# Patient Record
Sex: Male | Born: 1935 | Race: White | Hispanic: No | Smoking: Former smoker
Health system: Southern US, Community
[De-identification: ages and names within clinical notes are randomized; demographics above are authoritative.]

## PROBLEM LIST (undated history)

## (undated) DIAGNOSIS — I2510 Atherosclerotic heart disease of native coronary artery without angina pectoris: Secondary | ICD-10-CM

## (undated) DIAGNOSIS — E785 Hyperlipidemia, unspecified: Secondary | ICD-10-CM

## (undated) DIAGNOSIS — K635 Polyp of colon: Secondary | ICD-10-CM

## (undated) DIAGNOSIS — N529 Male erectile dysfunction, unspecified: Secondary | ICD-10-CM

## (undated) DIAGNOSIS — I10 Essential (primary) hypertension: Secondary | ICD-10-CM

## (undated) DIAGNOSIS — Z85038 Personal history of other malignant neoplasm of large intestine: Secondary | ICD-10-CM

## (undated) HISTORY — DX: Hyperlipidemia, unspecified: E78.5

## (undated) HISTORY — DX: Personal history of other malignant neoplasm of large intestine: Z85.038

## (undated) HISTORY — PX: TONSILLECTOMY: SUR1361

## (undated) HISTORY — DX: Atherosclerotic heart disease of native coronary artery without angina pectoris: I25.10

## (undated) HISTORY — DX: Essential (primary) hypertension: I10

## (undated) HISTORY — DX: Male erectile dysfunction, unspecified: N52.9

## (undated) HISTORY — DX: Polyp of colon: K63.5

## (undated) HISTORY — PX: CARDIAC CATHETERIZATION: SHX172

## (undated) HISTORY — PX: APPENDECTOMY: SHX54

---

## 1993-05-13 HISTORY — PX: PARTIAL COLECTOMY: SHX5273

## 1998-02-01 ENCOUNTER — Ambulatory Visit (HOSPITAL_COMMUNITY): Admission: RE | Admit: 1998-02-01 | Discharge: 1998-02-01 | Payer: Self-pay | Admitting: Gastroenterology

## 2004-03-14 ENCOUNTER — Ambulatory Visit: Payer: Self-pay | Admitting: Gastroenterology

## 2004-04-02 ENCOUNTER — Ambulatory Visit: Payer: Self-pay | Admitting: Gastroenterology

## 2007-08-12 HISTORY — PX: TYMPANOSTOMY TUBE PLACEMENT: SHX32

## 2008-04-01 ENCOUNTER — Encounter: Payer: Self-pay | Admitting: Cardiovascular Disease

## 2009-01-29 ENCOUNTER — Emergency Department (HOSPITAL_COMMUNITY): Admission: EM | Admit: 2009-01-29 | Discharge: 2009-01-29 | Payer: Self-pay | Admitting: Emergency Medicine

## 2009-02-13 ENCOUNTER — Encounter (INDEPENDENT_AMBULATORY_CARE_PROVIDER_SITE_OTHER): Payer: Self-pay | Admitting: *Deleted

## 2009-03-22 ENCOUNTER — Ambulatory Visit: Payer: Self-pay | Admitting: Gastroenterology

## 2009-04-05 ENCOUNTER — Ambulatory Visit: Payer: Self-pay | Admitting: Gastroenterology

## 2010-04-01 ENCOUNTER — Encounter: Payer: Self-pay | Admitting: Cardiovascular Disease

## 2010-12-03 ENCOUNTER — Encounter: Payer: Self-pay | Admitting: Cardiovascular Disease

## 2010-12-17 ENCOUNTER — Encounter: Payer: Self-pay | Admitting: Cardiovascular Disease

## 2010-12-18 ENCOUNTER — Encounter: Payer: Self-pay | Admitting: Cardiovascular Disease

## 2010-12-18 ENCOUNTER — Ambulatory Visit (INDEPENDENT_AMBULATORY_CARE_PROVIDER_SITE_OTHER): Payer: Medicare Other | Admitting: Cardiovascular Disease

## 2010-12-18 DIAGNOSIS — R079 Chest pain, unspecified: Secondary | ICD-10-CM | POA: Insufficient documentation

## 2010-12-18 NOTE — Progress Notes (Signed)
History of Present Illness:77 yo WM with history of colon cancer, HLD who is here today to establish cardiac care. His parents both died from strokes. No prior cardiac disease. He tells me that he has had two "events". In June he was cutting the grass and felt pains in both arms and heaviness in the chest. This was associated with SOB and diaphoresis. He rested and this went away. Lasted for five minutes. Then this occurred again two weeks ago. Same type of symptoms. He feels well today. His wife is also my patient and survived a STEMI this spring. He is concerned that he may have CAD as well.   Past Medical History  Diagnosis Date  . History of colon cancer     Followed by Dr Sheryn Bison  . Hearing loss     wears hearing aids  . ED (erectile dysfunction)     Past Surgical History  Procedure Date  . Partial colectomy 1995    Dr Samuella Cota  . Tympanostomy tube placement 4/09    Current Outpatient Prescriptions  Medication Sig Dispense Refill  . aspirin 81 MG tablet Take 81 mg by mouth daily.        Marland Kitchen b complex vitamins capsule Take 1 capsule by mouth daily.        . Multiple Vitamin (MULTIVITAMIN) tablet Take 1 tablet by mouth daily.        . nitroGLYCERIN (NITROSTAT) 0.4 MG SL tablet Place 0.4 mg under the tongue every 5 (five) minutes as needed.          Allergies not on file  History   Social History  . Marital Status: Married    Spouse Name: N/A    Number of Children: 2  . Years of Education: 48yrs   Occupational History  . retired Occupational hygienist    Social History Main Topics  . Smoking status: Former Smoker -- 1.0 packs/day for 17 years    Types: Cigarettes    Quit date: 05/13/1976  . Smokeless tobacco: Not on file  . Alcohol Use: 4.2 oz/week    7 Glasses of wine per week  . Drug Use:   . Sexually Active:    Other Topics Concern  . Not on file   Social History Narrative  . No narrative on file    Family History  Problem Relation Age of Onset  . Heart failure Mother    . Dementia Mother   . Stroke Father 31    Review of Systems:  As stated in the HPI and otherwise negative.   BP 132/82  Pulse 61  Resp 16  Ht 5\' 10"  (1.778 m)  Wt 175 lb (79.379 kg)  BMI 25.11 kg/m2  Physical Examination: General: Well developed, well nourished, NAD HEENT: OP clear, mucus membranes moist SKIN: warm, dry. No rashes. Neuro: No focal deficits Musculoskeletal: Muscle strength 5/5 all ext Psychiatric: Mood and affect normal Neck: No JVD, no carotid bruits, no thyromegaly, no lymphadenopathy. Lungs:Clear bilaterally, no wheezes, rhonci, crackles Cardiovascular: Regular rate and rhythm. No murmurs, gallops or rubs. Abdomen:Soft. Bowel sounds present. Non-tender.  Extremities: No lower extremity edema. Pulses are 2 + in the bilateral DP/PT.  EKG:NSR, rate 61 bpm.

## 2010-12-18 NOTE — Assessment & Plan Note (Addendum)
He has exertional chest pressure with h/o HLD, former tobacco use and FH of vascular disease. Will arrange stress myoview to exclude ischemia and echo to r/o structural heart disease.

## 2010-12-18 NOTE — Patient Instructions (Signed)
Your physician recommends that you schedule a follow-up appointment in:2-3 weeks with Dr. Clifton James  Your physician has requested that you have an echocardiogram. Echocardiography is a painless test that uses sound waves to create images of your heart. It provides your doctor with information about the size and shape of your heart and how well your heart's chambers and valves are working. This procedure takes approximately one hour. There are no restrictions for this procedure.  Your physician has requested that you have en exercise stress myoview. For further information please visit [email or web address] Please follow instruction sheet, as given.

## 2011-01-01 ENCOUNTER — Encounter: Payer: Self-pay | Admitting: Cardiology

## 2011-01-01 ENCOUNTER — Ambulatory Visit (INDEPENDENT_AMBULATORY_CARE_PROVIDER_SITE_OTHER): Payer: Medicare Other | Admitting: Cardiovascular Disease

## 2011-01-01 ENCOUNTER — Ambulatory Visit (HOSPITAL_BASED_OUTPATIENT_CLINIC_OR_DEPARTMENT_OTHER): Payer: Medicare Other | Admitting: Radiology

## 2011-01-01 ENCOUNTER — Encounter: Payer: Self-pay | Admitting: Cardiovascular Disease

## 2011-01-01 DIAGNOSIS — R079 Chest pain, unspecified: Secondary | ICD-10-CM

## 2011-01-01 DIAGNOSIS — E785 Hyperlipidemia, unspecified: Secondary | ICD-10-CM | POA: Insufficient documentation

## 2011-01-01 DIAGNOSIS — Z01810 Encounter for preprocedural cardiovascular examination: Secondary | ICD-10-CM

## 2011-01-01 DIAGNOSIS — R072 Precordial pain: Secondary | ICD-10-CM | POA: Insufficient documentation

## 2011-01-01 DIAGNOSIS — R0789 Other chest pain: Secondary | ICD-10-CM

## 2011-01-01 DIAGNOSIS — R9430 Abnormal result of cardiovascular function study, unspecified: Secondary | ICD-10-CM | POA: Insufficient documentation

## 2011-01-01 DIAGNOSIS — I491 Atrial premature depolarization: Secondary | ICD-10-CM

## 2011-01-01 DIAGNOSIS — R9431 Abnormal electrocardiogram [ECG] [EKG]: Secondary | ICD-10-CM

## 2011-01-01 DIAGNOSIS — I079 Rheumatic tricuspid valve disease, unspecified: Secondary | ICD-10-CM | POA: Insufficient documentation

## 2011-01-01 DIAGNOSIS — I4949 Other premature depolarization: Secondary | ICD-10-CM

## 2011-01-01 DIAGNOSIS — R0609 Other forms of dyspnea: Secondary | ICD-10-CM

## 2011-01-01 LAB — CBC WITH DIFFERENTIAL/PLATELET
Basophils Absolute: 0 10*3/uL (ref 0.0–0.1)
Eosinophils Relative: 1.9 % (ref 0.0–5.0)
Lymphocytes Relative: 21.7 % (ref 12.0–46.0)
Monocytes Relative: 8.7 % (ref 3.0–12.0)
Neutrophils Relative %: 67.2 % (ref 43.0–77.0)
Platelets: 189 10*3/uL (ref 150.0–400.0)
RDW: 14.1 % (ref 11.5–14.6)
WBC: 5.9 10*3/uL (ref 4.5–10.5)

## 2011-01-01 LAB — PROTIME-INR
INR: 1 ratio (ref 0.8–1.0)
Prothrombin Time: 11 s (ref 10.2–12.4)

## 2011-01-01 LAB — BASIC METABOLIC PANEL
BUN: 15 mg/dL (ref 6–23)
Calcium: 9.9 mg/dL (ref 8.4–10.5)
GFR: 81.69 mL/min (ref >60.00)
Glucose, Bld: 101 mg/dL — ABNORMAL HIGH (ref 70–99)

## 2011-01-01 MED ORDER — TECHNETIUM TC 99M TETROFOSMIN IV KIT
11.0000 | PACK | Freq: Once | INTRAVENOUS | Status: AC | PRN
  Administered 2011-01-01: 11 via INTRAVENOUS

## 2011-01-01 MED ORDER — TECHNETIUM TC 99M TETROFOSMIN IV KIT
33.0000 | PACK | Freq: Once | INTRAVENOUS | Status: AC | PRN
  Administered 2011-01-01: 33 via INTRAVENOUS

## 2011-01-01 NOTE — Assessment & Plan Note (Signed)
See above. Plan cath.  

## 2011-01-01 NOTE — Progress Notes (Signed)
[facility] SITE 3 NUCLEAR MED [address] [place] [address] [phone]  Cardiology Nuclear Med Study  Daniel [name] is a 77 y.o. male [id number] [birth year]   Nuclear Med Background Indication for Stress Test:  Evaluation for Ischemia History:  No previous documented CAD Cardiac Risk Factors: History of Smoking and Lipids  Symptoms:  Chest Pressure, Diaphoresis, Dizziness, DOE, Nausea, Palpitations and SOB   Nuclear Pre-Procedure Caffeine/Decaff Intake:  None NPO After: 7:00pm   Lungs:  clear IV 0.9% NS with Angio Cath:  20g  IV Site: R Antecubital  IV Started by:  Stanton Kidney, EMT-P  Chest Size (in):  40 Cup Size: n/a  Height: 5\' 10"  (1.778 m)  Weight:  173 lb (78.472 kg)  BMI:  Body mass index is 24.82 kg/(m^2). Tech Comments:  This patient walked the treadmill and experienced chest pain and significant ST,T, changes on his EKG. The patient's cardiologist C.McAlhany consulted and saw the patient this day. The patient was given 1 sublingual Nitro with total relief of pain. S.Williams EMTP  T  Nuclear Med Study 1 or 2 day study: 1 day  Stress Test Type:  Stress  Reading MD: Willa Rough, MD  Order Authorizing Provider:  C.McAlhany  Resting Radionuclide: Technetium 107m Tetrofosmin  Resting Radionuclide Dose: 11.0 mCi   Stress Radionuclide:  Technetium 82m Tetrofosmin  Stress Radionuclide Dose: 33.0 mCi           Stress Protocol Rest HR: 54 Stress HR: 129  Rest BP: 141/71 Stress BP: 163/88  Exercise Time (min): 5:15 METS: 6.80   Predicted Max HR: 143 bpm % Max HR: 90.21 bpm Rate Pressure Product: 24401   Dose of Adenosine (mg):  n/a Dose of Lexiscan: n/a mg  Dose of Atropine (mg): n/a Dose of Dobutamine: n/a mcg/kg/min (at max HR)  Stress Test Technologist: Milana Na, EMT-P  Nuclear Technologist:  Doyne Keel, CNMT     Rest Procedure:  Myocardial perfusion imaging was performed at rest 45 minutes following the intravenous  administration of Technetium 84m Tetrofosmin. Rest ECG: Sinus Bradycardia with pvcs  Stress Procedure:  The patient exercised for 5:15.  The patient stopped due to fatigue and chest pain 4/10.  There were + significant ST-T wave changes and occ pvcs/pacs with exercise.  Technetium 43m Tetrofosmin was injected at peak exercise and myocardial perfusion imaging was performed after a brief delay. Stress ECG: Significant ST abnormalities consistent with ischemia.  QPS Raw Data Images:  Patient motion noted; appropriate software correction applied. Stress Images:  Marked decrease in activity in the entire anterior wall,  Septum,  and apex. Rest Images:  Slight decrease in activity in the septum Subtraction (SDS):  Marked ischemia Transient Ischemic Dilatation (Normal <1.22):  1.15 Lung/Heart Ratio (Normal <0.45):  0.35  Quantitative Gated Spect Images QGS EDV:  125 ml QGS ESV:  67 ml QGS cine images:  hypokinesis of the anterior wall and septum and apex QGS EF: 46%  Impression Exercise Capacity:  Poor exercise capacity. BP Response:  Hypotensive blood pressure response. Clinical Symptoms:  Significant chest pain. ECG Impression:  Significant ST abnormalities consistent with ischemia. Comparison with Prior Nuclear Study: No previous nuclear study performed  Overall Impression:  Abnormal stress nuclear study. There is evidence of significant ischemia. The patient was seen immediately by Dr. Alma Downs

## 2011-01-01 NOTE — Patient Instructions (Signed)
Your physician has requested that you have a cardiac catheterization. Cardiac catheterization is used to diagnose and/or treat various heart conditions. Doctors may recommend this procedure for a number of different reasons. The most common reason is to evaluate chest pain. Chest pain can be a symptom of coronary artery disease (CAD), and cardiac catheterization can show whether plaque is narrowing or blocking your heart's arteries. This procedure is also used to evaluate the valves, as well as measure the blood flow and oxygen levels in different parts of your heart. For further information please visit [email or web address] Please follow instruction sheet, as given. Tomorrow 8/22 @ 7:30 am. BE THERE at 6:30 am.

## 2011-01-01 NOTE — Assessment & Plan Note (Signed)
Abnormal myoview c/w a large area of reversible ischemia. Will plan left heart cath in am in the oupt cath lab. Will get precath labs today. D/w pt and his wife. He agrees to proceed.

## 2011-01-01 NOTE — Progress Notes (Signed)
History of Present Illness::77 yo WM with history of colon cancer, HLD who is here today for cardiac follow up. I saw him two weeks ago for cardiac evaluation. He described two "events". In June he was cutting the grass and felt pains in both arms and heaviness in the chest. This was associated with SOB and diaphoresis. He rested and this went away. Lasted for five minutes. This occurred several more times. His wife is also my patient and survived a STEMI this spring. I set up a stress myoview which was performed today. He had chest pressure at peak exercise and EKG changes c/w ischemia. His myoview images showed a large area of reversible ischemia including the septum and apex. He has no change in his clinical status.    Past Medical History  Diagnosis Date  . History of colon cancer     Followed by Dr Sheryn Bison  . Hearing loss     wears hearing aids  . ED (erectile dysfunction)     Past Surgical History  Procedure Date  . Partial colectomy 1995    Dr Samuella Cota  . Tympanostomy tube placement 4/09  . Appendectomy   . Tonsillectomy     Current Outpatient Prescriptions  Medication Sig Dispense Refill  . aspirin 81 MG tablet Take 81 mg by mouth daily.        Marland Kitchen b complex vitamins capsule Take 1 capsule by mouth daily.        . Multiple Vitamin (MULTIVITAMIN) tablet Take 1 tablet by mouth daily.        . nitroGLYCERIN (NITROSTAT) 0.4 MG SL tablet Place 0.4 mg under the tongue every 5 (five) minutes as needed.         No current facility-administered medications for this visit.   Facility-Administered Medications Ordered in Other Visits  Medication Dose Route Frequency Provider Last Rate Last Dose  . technetium tetrofosmin (TC-MYOVIEW) injection 11 milli Curie  11 milli Curie Intravenous Once PRN Luis Abed, MD   11 milli Curie at 01/01/11 [phone]  . technetium tetrofosmin (TC-MYOVIEW) injection 33 milli Curie  33 milli Curie Intravenous Once PRN Luis Abed, MD   33 milli Curie at  01/01/11 1100    No Known Allergies  History   Social History  . Marital Status: Married    Spouse Name: N/A    Number of Children: 2  . Years of Education: 1yrs   Occupational History  . retired Occupational hygienist    Social History Main Topics  . Smoking status: Former Smoker -- 1.0 packs/day for 17 years    Types: Cigarettes    Quit date: 05/13/1976  . Smokeless tobacco: Not on file  . Alcohol Use: 4.2 oz/week    7 Glasses of wine per week  . Drug Use:   . Sexually Active:    Other Topics Concern  . Not on file   Social History Narrative  . No narrative on file    Family History  Problem Relation Age of Onset  . Heart failure Mother   . Dementia Mother   . Stroke Father 9    Review of Systems:  As stated in the HPI and otherwise negative.   BP 131/66  Pulse 54  Physical Examination: General: Well developed, well nourished, NAD HEENT: OP clear, mucus membranes moist SKIN: warm, dry. No rashes. Neuro: No focal deficits Musculoskeletal: Muscle strength 5/5 all ext Psychiatric: Mood and affect normal Neck: No JVD, no carotid bruits, no thyromegaly, no lymphadenopathy.  Lungs:Clear bilaterally, no wheezes, rhonci, crackles Cardiovascular: Regular rate and rhythm. No murmurs, gallops or rubs. Abdomen:Soft. Bowel sounds present. Non-tender.  Extremities: No lower extremity edema. Pulses are 2 + in the bilateral DP/PT.  Stress myoview: 01/01/11:  Septal and apical ischemia. LVEF 46%.

## 2011-01-02 ENCOUNTER — Inpatient Hospital Stay (HOSPITAL_COMMUNITY): Payer: Medicare Other

## 2011-01-02 ENCOUNTER — Inpatient Hospital Stay (HOSPITAL_BASED_OUTPATIENT_CLINIC_OR_DEPARTMENT_OTHER)
Admission: RE | Admit: 2011-01-02 | Discharge: 2011-01-02 | Disposition: A | Discharge status: Home Health | Payer: Medicare Other | Source: Ambulatory Visit | Attending: Cardiovascular Disease | Admitting: Cardiovascular Disease

## 2011-01-02 ENCOUNTER — Inpatient Hospital Stay (HOSPITAL_COMMUNITY)
Admission: AD | Admit: 2011-01-02 | Discharge: 2011-01-08 | DRG: 234 | Disposition: A | Discharge status: Home Health | Payer: Medicare Other | Source: Ambulatory Visit | Attending: Surgery | Admitting: Surgery

## 2011-01-02 DIAGNOSIS — I519 Heart disease, unspecified: Secondary | ICD-10-CM | POA: Diagnosis present

## 2011-01-02 DIAGNOSIS — D696 Thrombocytopenia, unspecified: Secondary | ICD-10-CM | POA: Diagnosis present

## 2011-01-02 DIAGNOSIS — I200 Unstable angina: Secondary | ICD-10-CM | POA: Diagnosis present

## 2011-01-02 DIAGNOSIS — K5900 Constipation, unspecified: Secondary | ICD-10-CM | POA: Diagnosis present

## 2011-01-02 DIAGNOSIS — E785 Hyperlipidemia, unspecified: Secondary | ICD-10-CM | POA: Diagnosis present

## 2011-01-02 DIAGNOSIS — D62 Acute posthemorrhagic anemia: Secondary | ICD-10-CM | POA: Diagnosis not present

## 2011-01-02 DIAGNOSIS — Z85038 Personal history of other malignant neoplasm of large intestine: Secondary | ICD-10-CM | POA: Insufficient documentation

## 2011-01-02 DIAGNOSIS — I2510 Atherosclerotic heart disease of native coronary artery without angina pectoris: Principal | ICD-10-CM | POA: Diagnosis present

## 2011-01-02 DIAGNOSIS — Z87891 Personal history of nicotine dependence: Secondary | ICD-10-CM

## 2011-01-02 LAB — CBC
MCH: 33.8 pg (ref 26.0–34.0)
Platelets: 168 10*3/uL (ref 150–400)
RBC: 4.23 MIL/uL (ref 4.22–5.81)
WBC: 6.9 10*3/uL (ref 4.0–10.5)

## 2011-01-02 LAB — COMPREHENSIVE METABOLIC PANEL
ALT: 16 U/L (ref 0–53)
Albumin: 3.6 g/dL (ref 3.5–5.2)
Calcium: 9.6 mg/dL (ref 8.4–10.5)
GFR calc Af Amer: >60 mL/min (ref >60)
Glucose, Bld: 108 mg/dL — ABNORMAL HIGH (ref 70–99)
Sodium: 139 mEq/L (ref 135–145)
Total Protein: 6.7 g/dL (ref 6.0–8.3)

## 2011-01-02 LAB — PROTIME-INR
INR: 1.02 (ref 0.00–1.49)
Prothrombin Time: 13.6 seconds (ref 11.6–15.2)

## 2011-01-02 LAB — APTT: aPTT: 32 seconds (ref 24–37)

## 2011-01-02 NOTE — Cardiovascular Report (Signed)
[name], Daniel [name] NO.:  [id number]  MEDICAL RECORD NO.:  [id number]  LOCATION:                                 FACILITY:  PHYSICIAN:  Verne Carrow, MDDATE OF BIRTH:  1932-01-19  DATE OF PROCEDURE:  01/02/2011 DATE OF DISCHARGE:                           CARDIAC CATHETERIZATION   PRIMARY CARE PHYSICIAN:  Robert L. Foy Guadalajara, MD  PROCEDURES PERFORMED: 1. Left heart catheterization. 2. Selective coronary angiography. 3. Left ventricular angiogram.  OPERATOR:  Verne Carrow, MD  INDICATION:  This is a very pleasant 77 year old Caucasian male with a history of colon cancer in remission as well as hyperlipidemia who I saw in the office several weeks ago with complaints of exertional chest pain.  I actually took care of the patient's wife earlier this year and he was self-referral for chest pain.  Based on his symptoms, we arranged exercise stress Myoview test yesterday in the office.  With 5 minutes of exercise, the patient developed severe substernal chest pressure as well as ST-segment depression.  His stress Myoview images showed reversible ischemia in the anterior wall as well as the septum and apex.  Based on these findings, we arranged for a diagnostic left heart catheterization this morning.  DETAILS OF PROCEDURE:  The patient was brought to the outpatient cardiac catheterization laboratory after signing informed consent for the procedure.  The right groin was prepped and draped in sterile fashion. 1% lidocaine was used for local anesthesia.  A 4-French sheath was inserted into the right femoral artery without difficulty.  Standard diagnostic catheters were used to perform selective coronary angiography.  A pigtail catheter was used to perform a left ventricular angiogram.  All catheter exchanges were performed over a wire.  The patient tolerated the procedure well.  The patient was taken to the recovery room in stable condition.  There  were no immediate complications.  HEMODYNAMIC FINDINGS:  Central aortic pressure 144/72.  Left ventricular pressure 144/90/27.  ANGIOGRAPHIC FINDINGS: 1. The left main coronary artery had distal 60% to 70% stenosis. 2. The left anterior descending artery was a large vessel that coursed     to the apex and gave off 3 small diagonal branches.  The left     anterior descending artery had a severe 99% stenosis extending from     the ostium down into the proximal vessel.  There was no other     significant disease in the left anterior descending artery. 3. Circumflex artery was a large dominant vessel with four obtuse     marginal branches.  There was no disease in this vessel. 4. The right coronary artery was a small nondominant vessel with no     disease. 5. Left ventricular angiogram was performed in the RAO projection and     showed low normal left ventricular systolic function with ejection     fraction of 50%.  There was hypokinesis of the apex and the     inferoapical wall.  IMPRESSION: 1. Unstable angina. 2. Severe left main artery disease with involvement of the ostium of     the left anterior descending artery. 3. Mild left ventricular systolic dysfunction.  RECOMMENDATIONS:  At  this time, we will admit the patient to a telemetry bed.  We will start him on a beta-blocker and aspirin and statin.  We will obtain a consultation to the Cardiothoracic Surgery Service for a potential coronary artery bypass grafting surgery.  I reviewed the findings with the patient and his wife.  I feel that the involvement of the left main artery in the ostium of the left anterior descending artery puts this in a position where percutaneous therapy would be more risky for this patient.  I think that he would do well with bypass of the left anterior descending artery.  I will discuss further with my surgical colleagues.     Verne Carrow, MD     CM/MEDQ  D:  01/02/2011  T:   01/02/2011  Job:  [id number]  cc:   Molly Maduro L. Foy Guadalajara, M.D.  Electronically Signed by Verne Carrow MD on 01/02/2011 01:31:27 PM

## 2011-01-03 DIAGNOSIS — Z01810 Encounter for preprocedural cardiovascular examination: Secondary | ICD-10-CM

## 2011-01-03 DIAGNOSIS — I2510 Atherosclerotic heart disease of native coronary artery without angina pectoris: Secondary | ICD-10-CM

## 2011-01-03 LAB — HEMOGLOBIN A1C
Hgb A1c MFr Bld: 5.6 % (ref <5.7)
Mean Plasma Glucose: 114 mg/dL (ref <117)

## 2011-01-03 LAB — URINALYSIS, ROUTINE W REFLEX MICROSCOPIC
Leukocytes, UA: NEGATIVE
Nitrite: NEGATIVE
Protein, ur: NEGATIVE mg/dL
Specific Gravity, Urine: 1.012 (ref 1.005–1.030)
Urobilinogen, UA: 0.2 mg/dL (ref 0.0–1.0)

## 2011-01-03 LAB — LIPID PANEL
Cholesterol: 195 mg/dL (ref 0–200)
LDL Cholesterol: 100 mg/dL — ABNORMAL HIGH (ref 0–99)
VLDL: 13 mg/dL (ref 0–40)

## 2011-01-04 ENCOUNTER — Inpatient Hospital Stay (HOSPITAL_COMMUNITY): Payer: Medicare Other

## 2011-01-04 DIAGNOSIS — I2510 Atherosclerotic heart disease of native coronary artery without angina pectoris: Secondary | ICD-10-CM

## 2011-01-04 HISTORY — PX: OTHER SURGICAL HISTORY: SHX169

## 2011-01-04 LAB — POCT I-STAT 3, ART BLOOD GAS (G3+)
Acid-base deficit: 2 mmol/L (ref 0.0–2.0)
Bicarbonate: 24.8 mEq/L — ABNORMAL HIGH (ref 20.0–24.0)
Bicarbonate: 23.2 mEq/L (ref 20.0–24.0)
O2 Saturation: 100 %
O2 Saturation: 100 %
O2 Saturation: 100 %
O2 Saturation: 99 %
Patient temperature: 36.6
TCO2: 26 mmol/L (ref 0–100)
TCO2: 24 mmol/L (ref 0–100)
TCO2: 24 mmol/L (ref 0–100)
pCO2 arterial: 42.7 mmHg (ref 35.0–45.0)
pCO2 arterial: 37 mmHg (ref 35.0–45.0)
pCO2 arterial: 32.7 mmHg — ABNORMAL LOW (ref 35.0–45.0)
pCO2 arterial: 32.8 mmHg — ABNORMAL LOW (ref 35.0–45.0)
pCO2 arterial: 41.7 mmHg (ref 35.0–45.0)
pH, Arterial: 7.374 (ref 7.350–7.450)
pH, Arterial: 7.434 (ref 7.350–7.450)
pH, Arterial: 7.35 (ref 7.350–7.450)
pO2, Arterial: 475 mmHg — ABNORMAL HIGH (ref 80.0–100.0)
pO2, Arterial: 404 mmHg — ABNORMAL HIGH (ref 80.0–100.0)

## 2011-01-04 LAB — POCT I-STAT 4, (NA,K, GLUC, HGB,HCT)
Glucose, Bld: 138 mg/dL — ABNORMAL HIGH (ref 70–99)
Glucose, Bld: 91 mg/dL (ref 70–99)
Glucose, Bld: 118 mg/dL — ABNORMAL HIGH (ref 70–99)
Glucose, Bld: 120 mg/dL — ABNORMAL HIGH (ref 70–99)
HCT: 37 % — ABNORMAL LOW (ref 39.0–52.0)
HCT: 36 % — ABNORMAL LOW (ref 39.0–52.0)
HCT: 31 % — ABNORMAL LOW (ref 39.0–52.0)
HCT: 27 % — ABNORMAL LOW (ref 39.0–52.0)
Hemoglobin: 12.2 g/dL — ABNORMAL LOW (ref 13.0–17.0)
Hemoglobin: 10.5 g/dL — ABNORMAL LOW (ref 13.0–17.0)
Potassium: 4.5 mEq/L (ref 3.5–5.1)
Potassium: 4.5 mEq/L (ref 3.5–5.1)
Sodium: 137 mEq/L (ref 135–145)
Sodium: 137 mEq/L (ref 135–145)
Sodium: 134 mEq/L — ABNORMAL LOW (ref 135–145)
Sodium: 136 mEq/L (ref 135–145)

## 2011-01-04 LAB — CBC
HCT: 45.8 % (ref 39.0–52.0)
HCT: 33.7 % — ABNORMAL LOW (ref 39.0–52.0)
Hemoglobin: 11.2 g/dL — ABNORMAL LOW (ref 13.0–17.0)
MCH: 33.3 pg (ref 26.0–34.0)
MCH: 33.1 pg (ref 26.0–34.0)
MCHC: 34.5 g/dL (ref 30.0–36.0)
MCHC: 34.9 g/dL (ref 30.0–36.0)
MCV: 95 fL (ref 78.0–100.0)
Platelets: 91 10*3/uL — ABNORMAL LOW (ref 150–400)
RBC: 3.38 MIL/uL — ABNORMAL LOW (ref 4.22–5.81)
RBC: 3.56 MIL/uL — ABNORMAL LOW (ref 4.22–5.81)
RDW: 13.7 % (ref 11.5–15.5)
RDW: 13.2 % (ref 11.5–15.5)
WBC: 10.7 10*3/uL — ABNORMAL HIGH (ref 4.0–10.5)

## 2011-01-04 LAB — POCT I-STAT GLUCOSE: Glucose, Bld: 102 mg/dL — ABNORMAL HIGH (ref 70–99)

## 2011-01-04 LAB — BLOOD GAS, ARTERIAL
FIO2: 0.21 %
O2 Saturation: 97.9 %
Patient temperature: 98.6
pH, Arterial: 7.411 (ref 7.350–7.450)

## 2011-01-04 LAB — GLUCOSE, CAPILLARY
Glucose-Capillary: 144 mg/dL — ABNORMAL HIGH (ref 70–99)
Glucose-Capillary: 108 mg/dL — ABNORMAL HIGH (ref 70–99)

## 2011-01-04 LAB — BASIC METABOLIC PANEL
BUN: 14 mg/dL (ref 6–23)
Calcium: 10.4 mg/dL (ref 8.4–10.5)
GFR calc Af Amer: >60 mL/min (ref >60)
GFR calc non Af Amer: >60 mL/min (ref >60)
Glucose, Bld: 99 mg/dL (ref 70–99)
Potassium: 3.8 mEq/L (ref 3.5–5.1)
Sodium: 140 mEq/L (ref 135–145)

## 2011-01-04 LAB — CREATININE, SERUM
GFR calc Af Amer: >60 mL/min (ref >60)
GFR calc non Af Amer: >60 mL/min (ref >60)

## 2011-01-04 LAB — MAGNESIUM: Magnesium: 2.6 mg/dL — ABNORMAL HIGH (ref 1.5–2.5)

## 2011-01-04 LAB — PLATELET COUNT: Platelets: 109 10*3/uL — ABNORMAL LOW (ref 150–400)

## 2011-01-05 ENCOUNTER — Other Ambulatory Visit: Payer: Self-pay

## 2011-01-05 ENCOUNTER — Inpatient Hospital Stay (HOSPITAL_COMMUNITY): Payer: Medicare Other

## 2011-01-05 LAB — CBC
HCT: 33.6 % — ABNORMAL LOW (ref 39.0–52.0)
Hemoglobin: 11.1 g/dL — ABNORMAL LOW (ref 13.0–17.0)
MCH: 32.8 pg (ref 26.0–34.0)
MCHC: 34.4 g/dL (ref 30.0–36.0)
MCV: 95.7 fL (ref 78.0–100.0)
RBC: 3.39 MIL/uL — ABNORMAL LOW (ref 4.22–5.81)
RDW: 13.7 % (ref 11.5–15.5)
WBC: 8.5 10*3/uL (ref 4.0–10.5)
WBC: 11.9 10*3/uL — ABNORMAL HIGH (ref 4.0–10.5)

## 2011-01-05 LAB — BASIC METABOLIC PANEL
CO2: 23 mEq/L (ref 19–32)
Chloride: 106 mEq/L (ref 96–112)
GFR calc Af Amer: >60 mL/min (ref >60)
Potassium: 4.1 mEq/L (ref 3.5–5.1)

## 2011-01-05 LAB — GLUCOSE, CAPILLARY
Glucose-Capillary: 110 mg/dL — ABNORMAL HIGH (ref 70–99)
Glucose-Capillary: 127 mg/dL — ABNORMAL HIGH (ref 70–99)

## 2011-01-05 LAB — MAGNESIUM: Magnesium: 2.3 mg/dL (ref 1.5–2.5)

## 2011-01-05 LAB — CREATININE, SERUM: Creatinine, Ser: 0.91 mg/dL (ref 0.50–1.35)

## 2011-01-06 ENCOUNTER — Inpatient Hospital Stay (HOSPITAL_COMMUNITY): Payer: Medicare Other

## 2011-01-06 LAB — CROSSMATCH
ABO/RH(D): A POS
Unit division: 0

## 2011-01-06 LAB — BASIC METABOLIC PANEL
BUN: 18 mg/dL (ref 6–23)
CO2: 28 mEq/L (ref 19–32)
Calcium: 9.2 mg/dL (ref 8.4–10.5)
Chloride: 101 mEq/L (ref 96–112)
Creatinine, Ser: 0.79 mg/dL (ref 0.50–1.35)
GFR calc Af Amer: >60 mL/min (ref >60)
GFR calc non Af Amer: >60 mL/min (ref >60)
Glucose, Bld: 131 mg/dL — ABNORMAL HIGH (ref 70–99)
Potassium: 3.7 mEq/L (ref 3.5–5.1)
Sodium: 133 mEq/L — ABNORMAL LOW (ref 135–145)

## 2011-01-06 LAB — CBC
HCT: 31.4 % — ABNORMAL LOW (ref 39.0–52.0)
Hemoglobin: 10.6 g/dL — ABNORMAL LOW (ref 13.0–17.0)
MCH: 32.3 pg (ref 26.0–34.0)
MCHC: 33.8 g/dL (ref 30.0–36.0)
MCV: 95.7 fL (ref 78.0–100.0)
Platelets: 96 10*3/uL — ABNORMAL LOW (ref 150–400)
RBC: 3.28 MIL/uL — ABNORMAL LOW (ref 4.22–5.81)
RDW: 13.7 % (ref 11.5–15.5)
WBC: 9.2 10*3/uL (ref 4.0–10.5)

## 2011-01-06 LAB — GLUCOSE, CAPILLARY
Glucose-Capillary: 119 mg/dL — ABNORMAL HIGH (ref 70–99)
Glucose-Capillary: 107 mg/dL — ABNORMAL HIGH (ref 70–99)

## 2011-01-07 ENCOUNTER — Inpatient Hospital Stay (HOSPITAL_COMMUNITY): Payer: Medicare Other

## 2011-01-07 LAB — CBC
HCT: 31.1 % — ABNORMAL LOW (ref 39.0–52.0)
Hemoglobin: 10.8 g/dL — ABNORMAL LOW (ref 13.0–17.0)
MCH: 32.9 pg (ref 26.0–34.0)
MCHC: 34.7 g/dL (ref 30.0–36.0)
MCV: 94.8 fL (ref 78.0–100.0)
Platelets: 108 10*3/uL — ABNORMAL LOW (ref 150–400)
RBC: 3.28 MIL/uL — ABNORMAL LOW (ref 4.22–5.81)
RDW: 13.6 % (ref 11.5–15.5)
WBC: 7.8 10*3/uL (ref 4.0–10.5)

## 2011-01-07 LAB — BASIC METABOLIC PANEL
BUN: 15 mg/dL (ref 6–23)
CO2: 28 mEq/L (ref 19–32)
Calcium: 9.3 mg/dL (ref 8.4–10.5)
Chloride: 104 mEq/L (ref 96–112)
Creatinine, Ser: 0.68 mg/dL (ref 0.50–1.35)
GFR calc Af Amer: >60 mL/min (ref >60)
GFR calc non Af Amer: >60 mL/min (ref >60)
Glucose, Bld: 117 mg/dL — ABNORMAL HIGH (ref 70–99)
Potassium: 3.6 mEq/L (ref 3.5–5.1)
Sodium: 137 mEq/L (ref 135–145)

## 2011-01-08 LAB — POCT I-STAT, CHEM 8
Calcium, Ion: 1.23 mmol/L (ref 1.12–1.32)
Chloride: 102 mEq/L (ref 96–112)
HCT: 36 % — ABNORMAL LOW (ref 39.0–52.0)
Potassium: 4 mEq/L (ref 3.5–5.1)
Sodium: 135 mEq/L (ref 135–145)

## 2011-01-08 NOTE — Progress Notes (Signed)
Reviewed and ok. cdm

## 2011-01-08 NOTE — Progress Notes (Signed)
Reviewed and ok. cdm 

## 2011-01-09 ENCOUNTER — Ambulatory Visit: Payer: Medicare Other | Admitting: Cardiovascular Disease

## 2011-01-11 NOTE — Consult Note (Signed)
Daniel [name], Daniel [name] NO.:  [id number]  MEDICAL RECORD NO.:  [id number]  LOCATION:  3743                         FACILITY:  [facility]  PHYSICIAN:  Evelene Croon, M.D.     DATE OF BIRTH:  1931/12/01  DATE OF CONSULTATION:  01/02/2011 DATE OF DISCHARGE:                                CONSULTATION   REFERRING PHYSICIAN:  Verne Carrow, MD  REASON FOR CONSULTATION:  Left main and severe single-vessel coronary artery disease.  CLINICAL HISTORY:  I was asked by Dr. Clifton James to evaluate Daniel [name] for consideration of coronary artery bypass graft surgery.  He is a 77- year-old active gentleman with hyperlipidemia who has had only two episodes of chest discomfort.  The first one was in June was associated with mowing his lawn.  He developed chest discomfort radiating into both arms associated with shortness of breath and diaphoresis.  He rested on one knee for few minutes and this went away and he finished mowing his grass.  He said the second episode occurred at the end of July when he was driving to his primary physician's office and resolved.  He referred himself to Dr. Clifton James for evaluation since Dr. Clifton James had taken care of his wife who had a STEMI this past spring.  He underwent a stress Myoview examination, which showed a large area of reversible ischemia including the septum and apex.  He underwent cardiac catheterization today, which showed a diffusely calcified left main with 60% stenosis. There was 99% ostial and proximal LAD stenosis.  This was a large vessel that wrapped around the apex.  Left circumflex was a large dominant system with no disease with in it.  The right coronary artery was small and nondominant without any disease in it.  Left ventricular ejection fraction was about 50% with hypokinesis of the inferior apical region.  His review of systems is as follows:  GENERAL:  He denies any fever or chills.  He has had no recent weight  changes.  He has had some fatigue you that is noted for several months.  EYES:  Negative.  ENT:  He does have hearing loss and wears bilateral hearing aids.  ENDOCRINE:  He denies diabetes and hypothyroidism.  CARDIOVASCULAR:  As above.  He denies any PND or orthopnea.  He has had no peripheral edema or palpitations.  RESPIRATORY:  Denies cough or sputum production.  GI: Has had no nausea or vomiting.  Denies melena and bright red blood per rectum.  GU:  He denies dysuria or hematuria.  MUSCULOSKELETAL:  Denies arthralgias or myalgias.  NEUROLOGICAL:  Denies any focal weakness or numbness.  Denies dizziness or syncope.  He has never had TIA or stroke. HEMATOLOGICAL:  Negative.  ALLERGIES:  None.  MEDICATIONS:  None.  PAST MEDICAL HISTORY:  Significant for hyperlipidemia.  He has a history of colon cancer and underwent partial colectomy by Dr. Samuella Cota in 1995. He underwent appendectomy at that time.  He has had no evidence of recurrence and followed by Dr. Sheryn Bison with colonoscopy.  He is status post tonsillectomy as a child and status post tympanostomy tube placement as a child.  SOCIAL HISTORY:  He  is married and lives with his wife.  He has two children.  He is retired.  He works as a Occupational hygienist prior to retirement.  He is a previous smoker about one pack per day for 17 years, but quit over 30 years ago.  He drinks alcohol about one glass of wine per day.  FAMILY HISTORY:  Positive for heart failure in his mother.  Both his mother and father had strokes.  PHYSICAL EXAMINATION:  VITAL SIGNS:  His blood pressure is 125/65, pulse is 65 and regular, respiratory rate is 16 and unlabored. GENERAL:  He is a well-developed white male in no distress. HEENT:  Shows to be normocephalic and atraumatic.  Pupils are equal, reactive to light and accommodation.  Extraocular muscles are intact. Oropharynx is clear.  Teeth are good condition. NECK:  Shows normal carotid pulses bilaterally.   There are no bruits. There is no adenopathy or thyromegaly. CARDIAC:  Shows a regular rate and rhythm with normal S1 and S2.  There is no murmur, rub, or gallop. LUNGS:  Clear. ABDOMEN:  Shows active bowel sounds.  His abdomen is soft and nontender. There are no palpable masses or organomegaly. EXTREMITIES:  Shows no peripheral edema.  Pedal pulses are palpable bilaterally. SKIN:  Warm and dry. NEUROLOGIC:  Shows to be alert and oriented x3.  Motor and sensory exams are grossly normal.  IMPRESSION:  Daniel [name] has significant left main and high-grade ostial and proximal left anterior descending artery stenosis with a markedly abnormal stress Myoview examination.  I agree that coronary artery bypass graft surgery is the best treatment to prevent further ischemia and infarction and improve his quality of life.  I discussed the operative procedure with him including alternatives, benefits, and risks including but not limited to bleeding, blood transfusion, infection, stroke, myocardial infarction, graft failure, and death.  He understands all of this and agrees to proceed.  We will plan to do him on Friday, January 04, 2011.     Evelene Croon, M.D.     BB/MEDQ  D:  01/02/2011  T:  01/02/2011  Job:  [id number]  Electronically Signed by Evelene Croon M.D. on 01/11/2011 01:17:08 PM

## 2011-01-11 NOTE — Discharge Summary (Signed)
NAMEBRAELON, [name] NO.:  [id number]  MEDICAL RECORD NO.:  [id number]  LOCATION:  2018                         FACILITY:  [facility]  PHYSICIAN:  Evelene Croon, M.D.     DATE OF BIRTH:  Jul 11, 1931  DATE OF ADMISSION:  01/02/2011 DATE OF DISCHARGE:  01/08/2011                              DISCHARGE SUMMARY   ADMITTING DIAGNOSES: 1. Multivessel coronary artery disease (with an ejection fraction of     50%). 2. History of hyperlipidemia. 3. History of remote tobacco abuse. 4. History of colon cancer (status post partial colectomy in 1995).  DISCHARGE DIAGNOSES: 1. Multivessel coronary artery disease (with an ejection fraction of     50%). 2. History of hyperlipidemia. 3. History of remote tobacco abuse. 4. History of colon cancer (status post partial colectomy in 1995). 5. Thrombocytopenia. 6. Acute blood loss anemia.  PROCEDURES: 1. Cardiac catheterization performed by Dr. Clifton James on January 02, 2011. 2. Median sternotomy for CABG x2 (LIMA to LAD and SVG to distal circ     with EVH from the right thigh by Dr. Laneta Simmers on January 04, 2011.  HISTORY OF PRESENT ILLNESS:  This is a 77 year old Caucasian male with the aforementioned past medical history who has had 2 previous episodes of chest discomfort.  The first one occurred in June while he was mowing his lawn.  He developed chest discomfort that radiated into both arms. Associated with this, was shortness of breath and diaphoresis.  He rested for a few minutes and apparent the chest discomfort went away. The second episode occurred at the end of July when he was driving his primary care physician's office and again, this did resolve.  He was then referred to Dr. Clifton James since Dr. Clifton James had been taking care of his wife who had a previous STEMI this spring.  He underwent a stress Myoview which showed a large area of reversible ischemia including the septum and apex.  He then was admitted to [facility]  on January 02, 2011, in order to undergo a cardiac catheterization.  The patient was found to have a diffusely calcified left main with a 60% stenosis, a 99% ostial and proximal LAD stenosis and the left ventricular ejection fraction was 50% with hypokinesis of the inferior apical region.  A Cardiothoracic consultation was obtained with Dr. Laneta Simmers for consideration of coronary bypass grafting surgery.  The patient underwent preoperative carotid duplex, carotid ultrasound which showed no evidence of internal carotid artery stenosis bilaterally.  Potential risks, complications and benefits of the surgery were discussed with the patient and he agreed to proceed.  He underwent CABG x2 on January 04, 2011.  BRIEF HOSPITAL COURSE STAY:  The patient was extubated without difficulty the day of surgery.  He remained afebrile and hemodynamically stable.  His Swan-Ganz, A-line, Foley, and chest tubes were all removed early in his postoperative course.  He was found to have acute blood loss anemia.  His H and H went as low as 10.8 and 31.1.  He did not require postoperative transfusion.  In addition, he was found to have thrombocytopenia.  His platelet count went as low as 96,000, however, his  last platelet count was up to 108,000.  The patient was felt surgically stable for transfer from the Intensive Care Unit to [facility] for further convalescence on January 06, 2011.  He has already been tolerating a diet.  He has passed flatus, but has not had a bowel movement yet and will be giving a laxative of choice.  He was found to be volume overloaded and is being diuresed.  He is ambulating fairly well on his own and currently on postop day #3, he had T-max 99.2, his heart rate is in the 70s, BP 123/65, O2 sat 97% on room air.  Preop weight is 77 kg, today's weight down to 80 kg.  LABORATORY STUDIES DONE TODAY:  Potassium 3.6 which has been supplemented, BUN and creatine 15 and 0.6.  H and H 10.8 and 31.1,  white count of 7800, plate count 098,119.  PHYSICAL EXAMINATION:  CARDIOVASCULAR:  Regular rate and rhythm. PULMONARY:  Clear. ABDOMEN:  Nontender.  Bowel sounds present. EXTREMITIES:  Trace lower extremity edema, sternal and right lower extremity wounds are clean, dry and continuing to heal.  The patient's epicardial pacing wires and chest tube sutures will be removed prior to discharge.  Provided, he remains afebrile, hemodynamically stable, has a bowel movement and pending morning round evaluation and was surgically stable for discharge on January 08, 2011.  Latest laboratory studies as discussed above.  Latest chest x-ray done this morning, January 07, 2011, showed no evidence of pneumothorax, small bilateral pleural effusions, improvement in aeration.  DISCHARGE INSTRUCTIONS:  Diet:  Low-sodium heart-healthy. Activity:  The patient may shower.  He may walk up steps.  He is not to lift more than 10 pounds for 4 weeks.  He is not to drive until after 4 weeks.  He can continue with his breathing exercise daily.  He is to walk daily and increase his frequency and duration as tolerates. Wound Care:  He is to use soap and water on his wound and he is to contact the office if any wound problems arise.  FOLLOWUP APPOINTMENTS: 1. The patient needs to contact Dr. Gibson Ramp office for followup     appointment in 2 weeks. 2. The patient has an appointment to see Dr. Laneta Simmers on January 29, 2011, at 12:30 p.m.  The patient should have an x-ray obtained at     11:15 a.m. and be at the office appointment by 12.  DISCHARGE MEDICATIONS AT THE TIME OF THIS DICTATION: 1. Lasix 40 mg p.o. daily x5 days. 2. Potassium chloride 20 mEq p.o. daily x5 days. 3. Robitussin DM syrup 15 mL p.o. q.4 h p.r.n. cough. 4. Lopressor 25 mg 1/2 tablet p.o. 2 times daily. 5. Crestor 20 mg p.o. daily. 6. Ultram 50 mg 1-2 tablets p.o. q.4-6 hours p.r.n. pain. 7. Enteric-coated aspirin 81 mg p.o. daily. 8.  Multivitamin p.o. daily. 9. Vitamin B complex p.o. daily.  Please note the patient was not discharged on an ACE inhib or ARB as his bp was well controlled with a BB and he has a preserved left ventricular function.    Doree Fudge, PA   ______________________________ Evelene Croon, M.D.    DZ/MEDQ  D:  01/07/2011  T:  01/07/2011  Job:  [id number]  cc:   Verne Carrow, MD  Electronically Signed by Doree Fudge PA on 01/08/2011 09:40:20 AM Electronically Signed by Evelene Croon M.D. on 01/11/2011 01:17:12 PM

## 2011-01-11 NOTE — Op Note (Signed)
NAMEMADDIX, HEINZ NO.:  [id number]  MEDICAL RECORD NO.:  [id number]  LOCATION:  2308                         FACILITY:  [facility]  PHYSICIAN:  Evelene Croon, M.D.     DATE OF BIRTH:  Mar 09, 1932  DATE OF PROCEDURE:  01/04/2011 DATE OF DISCHARGE:                              OPERATIVE REPORT   PREOPERATIVE DIAGNOSIS:  Left main and severe single-vessel coronary artery disease.  POSTOPERATIVE DIAGNOSIS:  Left main and severe single-vessel coronary artery disease.  PROCEDURE:  Median sternotomy, extracorporeal circulation, coronary artery bypass graft surgery x2 using a left internal mammary artery graft to the left anterior descending coronary artery, with a saphenous vein graft to the distal left circumflex coronary artery.  Endoscopic vein harvesting from the right leg.  ATTENDING SURGEON:  Evelene Croon, MD  ASSISTANT:  Sheliah Plane, MD  SECOND ASSISTANT:  Doree Fudge, PA-C  ANESTHESIA:  General endotracheal.  CLINICAL HISTORY:  This patient is a 77 year old gentleman with prior cardiac history who had an episode of chest discomfort in June and another one in July associated with shortness of breath and diaphoresis. He referred himself to the cardiologist who was taken care of his wife after a recent myocardial infarction requiring percutaneous intervention.  He underwent a stress test, which showed a large area of reversible ischemia in the anterior wall septum and apex.  Cardiac catheterization showed a diffusely calcified left main with about 60% stenosis throughout.  The LAD had a 99% ostial and proximal stenosis. The left circumflex was a large dominant vessel that had no significant disease in it with a couple of marginal branches and then terminating in a large posterolateral branch.  The right coronary artery was a small nondominant vessel without disease.  Left ventricular function was well preserved.  After review of the  catheterization and examination of the patient, it was felt that coronary artery bypass graft surgery is the best treatment to prevent further ischemia and infarction.  I discussed the operative procedure with the patient including alternatives, benefits, and risks including, but not limited to bleeding, blood transfusion, infection, stroke, myocardial infarction, graft failure, and death.  He understood and agreed to proceed.  OPERATIVE PROCEDURE:  The patient was taken to the operating room, placed on the table in supine position.  After induction of general endotracheal anesthesia, a Foley catheter was placed in the bladder using sterile technique.  Then, the chest, abdomen and both lower extremities were prepped and draped in usual sterile manner.  The chest was entered through a median sternotomy incision and the pericardium of the midline.  Examination of heart showed good ventricular contractility.  The ascending aorta had no palpable plaques in it.  Then, the left internal mammary artery was harvested from the chest wall as a pedicle graft.  This was a medium caliber vessel with excellent blood flow through it.  At the same time, a segment of greater saphenous vein was harvested from the right leg using endoscopic vein harvest technique.  This vein was of medium size and good quality.  Then, the patient was heparinized.  When an adequate ACT was obtained, the distal ascending aorta was cannulated using a  20-French aortic cannula for arterial inflow.  Venous outflow was achieved using a two- stage venous cannula for the right atrial appendage.  Antegrade cardioplegia and vent cannula was inserted in the aortic root.  The patient was placed on cardiopulmonary bypass and distal coronary arteries were identified.  The LAD was a large graftable vessel with no distal disease in it.  The distal left circumflex branch was also a large vessel with no disease in it.  Then, the aorta  was crossclamped and 800 mL of cold blood antegrade cardioplegia was administered in the aortic root with quick arrest of the heart.  Systemic hypothermia to 32 degrees centigrade and topical hypothermia with iced saline was used.  Temperature probe was placed in the septum insulating the pad and pericardium.  The first distal anastomosis was performed to the distal left circumflex coronary artery.  The internal diameter of about 1.75 mm.  The conduit used was a segment of greater saphenous vein and anastomosis formed in an end-to-side manner using continuous 7-0 Prolene suture.  Flow was noted through the graft and was excellent.  Second distal anastomosis was performed to the mid LAD.  The internal diameter was about 2.5 mm.  The conduit used was a left internal mammary graft, it was brought through an opening of the left pericardium anterior to the phrenic nerve.  It was anastomose the LAD in an end-to- side manner using continuous 8-0 Prolene suture.  The pedicle was sutured the epicardium with 6-0 Prolene sutures.  Then, another dose of cardioplegia was given.  With the crossclamp in place, a single proximal vein graft anastomosis was performed to the mid ascending aorta in end- to-side manner using continuous 6-0 Prolene suture.  Then, the clamp was removed from the mammary pedicle.  There is rapid warming of the ventricular septum and return of spontaneous ventricular fibrillation. The crossclamp was removed with time of 42 minutes and the patient was defibrillated into sinus rhythm.  The proximal and distal anastomoses appeared hemostatic and allowed the grafts satisfactory.  A graft marker was placed around the proximal anastomoses.  Two temporary right ventricular and right atrial pacing wires were placed and brought out through the skin.  When the patient was rewarmed to 37 degrees centigrade, he was weaned from cardiopulmonary bypass on no inotropic agents.  Total bypass  time was 58 minutes.  Cardiac function appeared excellent with a cardiac output of 13 liters per minute.  Protamine was given and the venous and aortic cannulas were removed without difficulty.  Hemostasis was achieved.  Three chest tubes were placed with tube in the posterior pericardium, one in the left pleural space, and one in the anterior mediastinum.  The sternum was then reapproximated with double #6 stainless steel wires.  The fascia was closed with continuous #1 Vicryl suture.  The subcutaneous tissue was closed with continuous 2-0 Vicryl and the skin with a 3-0 Vicryl subcuticular closure.  The lower extremity vein harvest site was closed in layers in a similar manner.  The sponge, needle and instrument counts were correct according to the scrub nurse.  Dry sterile dressing applied over the incisions around the chest tubes, which were hooked to Pleur-Evac suction.  The patient remained hemodynamically stable and transported to the SICU in guarded, but stable condition.     Evelene Croon, M.D.     BB/MEDQ  D:  01/04/2011  T:  01/04/2011  Job:  [id number]  cc:   Verne Carrow, MD  Electronically Signed by  Evelene Croon M.D. on 01/11/2011 01:17:09 PM

## 2011-01-18 ENCOUNTER — Other Ambulatory Visit: Payer: Self-pay | Admitting: Surgery

## 2011-01-18 DIAGNOSIS — I2510 Atherosclerotic heart disease of native coronary artery without angina pectoris: Secondary | ICD-10-CM

## 2011-01-21 DIAGNOSIS — I2510 Atherosclerotic heart disease of native coronary artery without angina pectoris: Secondary | ICD-10-CM | POA: Insufficient documentation

## 2011-01-21 DIAGNOSIS — E785 Hyperlipidemia, unspecified: Secondary | ICD-10-CM

## 2011-01-22 ENCOUNTER — Encounter: Payer: Self-pay | Admitting: Surgery

## 2011-01-22 ENCOUNTER — Ambulatory Visit
Admission: RE | Admit: 2011-01-22 | Discharge: 2011-01-22 | Disposition: A | Discharge status: Home / Self Care | Payer: Medicare Other | Source: Ambulatory Visit | Attending: Surgery | Admitting: Surgery

## 2011-01-22 ENCOUNTER — Ambulatory Visit (INDEPENDENT_AMBULATORY_CARE_PROVIDER_SITE_OTHER): Payer: Self-pay | Admitting: Surgery

## 2011-01-22 VITALS — BP 139/75 | HR 90 | Resp 20 | Ht 70.0 in | Wt 165.0 lb

## 2011-01-22 DIAGNOSIS — Z951 Presence of aortocoronary bypass graft: Secondary | ICD-10-CM

## 2011-01-22 DIAGNOSIS — I2510 Atherosclerotic heart disease of native coronary artery without angina pectoris: Secondary | ICD-10-CM

## 2011-01-22 NOTE — Progress Notes (Deleted)
.  tct

## 2011-01-22 NOTE — Patient Instructions (Signed)
You may return to driving when you feel comfortable with that.  Do not lift anything heavier than 10 lbs for three months postoperatively. Return to see me if any problems develop with you incision; such as redness, swelling, or drainage.  

## 2011-01-22 NOTE — Progress Notes (Signed)
  HPI: Patient returns for routine postoperative follow-up having undergone coronary artery bypass graft surgery x2 on 01/04/2011. The patient's early postoperative recovery while in the hospital was notable for an uncomplicated postoperative course. Since hospital discharge the patient reports he has been walking about 15 minutes 3 times per day. He has had no chest pain or shortness of breath.   Current Outpatient Prescriptions  Medication Sig Dispense Refill  . aspirin 81 MG tablet Take 81 mg by mouth daily.        Marland Kitchen b complex vitamins capsule Take 1 capsule by mouth daily.        . metoprolol tartrate (LOPRESSOR) 25 MG tablet Take 25 mg by mouth 2 (two) times daily. 1/2 TABLET bid       . Multiple Vitamin (MULTIVITAMIN) tablet Take 1 tablet by mouth daily.        . nitroGLYCERIN (NITROSTAT) 0.4 MG SL tablet Place 0.4 mg under the tongue every 5 (five) minutes as needed.        . simvastatin (ZOCOR) 40 MG tablet Take 40 mg by mouth at bedtime.          Physical Exam: BP 139/75  Pulse 90  Resp 20  Ht 5\' 10"  (1.778 m)  Wt 165 lb (74.844 kg)  BMI 23.68 kg/m2  SpO2 98%  He looks well. Cardiac exam shows a regular rate and rhythm with normal heart sounds. Lung exam is clear. The chest incision is healing well and the sternum is stable. His right leg incision is healing well and there is no lower extremity edema.  Diagnostic Tests: Chest x-ray today shows clear lung fields and no pleural effusions.  Impression: Overall Daniel [name] is recovering well from his surgery. I encouraged him  to continue walking as much as possible and I told him not to lift anything heavier than 10 pounds for a total of 3 months from the date of surgery.  Plan: He will continue to follow up with Dr. Verne Carrow and will contact me if he develops any problems with his incisions.

## 2011-01-24 ENCOUNTER — Encounter: Payer: Self-pay | Admitting: Cardiovascular Disease

## 2011-01-24 ENCOUNTER — Ambulatory Visit (INDEPENDENT_AMBULATORY_CARE_PROVIDER_SITE_OTHER): Payer: Medicare Other | Admitting: Cardiovascular Disease

## 2011-01-24 VITALS — BP 128/80 | HR 70 | Ht 70.0 in | Wt 169.0 lb

## 2011-01-24 DIAGNOSIS — E785 Hyperlipidemia, unspecified: Secondary | ICD-10-CM

## 2011-01-24 DIAGNOSIS — I2510 Atherosclerotic heart disease of native coronary artery without angina pectoris: Secondary | ICD-10-CM

## 2011-01-24 NOTE — Patient Instructions (Signed)
Your physician recommends that you schedule a follow-up appointment in: 3 months. Your physician recommends that you continue on your current medications as directed. Please refer to the Current Medication list given to you today. 

## 2011-01-24 NOTE — Assessment & Plan Note (Signed)
On statin.

## 2011-01-24 NOTE — Progress Notes (Signed)
History of Present Illness:77 yo WM with history of colon cancer, HLD who is here today for cardiac follow up. I saw him two weeks ago for cardiac evaluation. He described two "events". In June he was cutting the grass and felt pains in both arms and heaviness in the chest. This was associated with SOB and diaphoresis. He rested and this went away. Lasted for five minutes. This occurred several more times. His wife is also my patient and survived a STEMI this spring. I set up a stress myoview which was performed today. He had chest pressure at peak exercise and EKG changes c/w ischemia. His myoview images showed a large area of reversible ischemia including the septum and apex. I arranged a cardiac cath which showed severe left main and LAD ostial stenosis. He underwent 2V CABG per Dr. Laneta Simmers on 01/04/11. He has done well.   He tells me that he is feeling well. His chest is sore but no exertional chest pain. His breathing has been ok. NO dizziness, near syncope or syncope.       Past Medical History  Diagnosis Date  . History of colon cancer     Followed by Dr Sheryn Bison  . Hearing loss     wears hearing aids  . ED (erectile dysfunction)   . CAD (coronary artery disease)   . Hyperlipidemia     Past Surgical History  Procedure Date  . Partial colectomy 1995    Dr Samuella Cota  . Tympanostomy tube placement 4/09  . Appendectomy   . Tonsillectomy   . Cardiac catheterization   . Cabg x 2 01/04/2011    Dr Laneta Simmers    Current Outpatient Prescriptions  Medication Sig Dispense Refill  . aspirin 81 MG tablet Take 81 mg by mouth daily.        Marland Kitchen b complex vitamins capsule Take 1 capsule by mouth daily.        . metoprolol tartrate (LOPRESSOR) 25 MG tablet Take 25 mg by mouth 2 (two) times daily. 1/2 TABLET bid       . Multiple Vitamin (MULTIVITAMIN) tablet Take 1 tablet by mouth daily.        . simvastatin (ZOCOR) 40 MG tablet Take 40 mg by mouth at bedtime.          No Known  Allergies  History   Social History  . Marital Status: Married    Spouse Name: N/A    Number of Children: 2  . Years of Education: 56yrs   Occupational History  . retired Occupational hygienist    Social History Main Topics  . Smoking status: Former Smoker -- 1.0 packs/day for 17 years    Types: Cigarettes    Quit date: 05/13/1976  . Smokeless tobacco: Not on file  . Alcohol Use: 4.2 oz/week    7 Glasses of wine per week  . Drug Use:   . Sexually Active:    Other Topics Concern  . Not on file   Social History Narrative  . No narrative on file    Family History  Problem Relation Age of Onset  . Heart failure Mother   . Dementia Mother   . Stroke Father 80    Review of Systems:  As stated in the HPI and otherwise negative.   BP 128/80  Pulse 70  Ht 5\' 10"  (1.778 m)  Wt 169 lb (76.658 kg)  BMI 24.25 kg/m2  Physical Examination: General: Well developed, well nourished, NAD HEENT: OP clear, mucus membranes  moist SKIN: warm, dry. No rashes. Neuro: No focal deficits Musculoskeletal: Muscle strength 5/5 all ext Psychiatric: Mood and affect normal Neck: No JVD, no carotid bruits, no thyromegaly, no lymphadenopathy. Lungs:Clear bilaterally, no wheezes, rhonci, crackles Cardiovascular: Regular rate and rhythm. No murmurs, gallops or rubs. Abdomen:Soft. Bowel sounds present. Non-tender.  Extremities: No lower extremity edema. Pulses are 2 + in the bilateral DP/PT.  EKG:NSR, rate 70 bpm. Biatrial enlargement.   Cardiac Cath 01/02/11:  1. The left main coronary artery had distal 60% to 70% stenosis.   2. The left anterior descending artery was a large vessel that coursed       to the apex and gave off 3 small diagonal branches.  The left       anterior descending artery had a severe 99% stenosis extending from       the ostium down into the proximal vessel.  There was no other       significant disease in the left anterior descending artery.   3. Circumflex artery was a large  dominant vessel with four obtuse       marginal branches.  There was no disease in this vessel.   4. The right coronary artery was a small nondominant vessel with no       disease.   5. Left ventricular angiogram was performed in the RAO projection and       showed low normal left ventricular systolic function with ejection       fraction of 50%.  There was hypokinesis of the apex and the       inferoapical wall.

## 2011-01-24 NOTE — Assessment & Plan Note (Addendum)
S/p 2V CABG. Stable. On ASA, beta blocker , statin. No changes. He will start cardiac rehab soon.

## 2011-01-29 ENCOUNTER — Encounter: Payer: Medicare Other | Admitting: Surgery

## 2011-02-06 ENCOUNTER — Other Ambulatory Visit: Payer: Self-pay | Admitting: *Deleted

## 2011-02-06 MED ORDER — METOPROLOL TARTRATE 25 MG PO TABS: 12.5000 mg | ORAL_TABLET | Freq: Two times a day (BID) | ORAL | Status: DC

## 2011-02-06 MED ORDER — SIMVASTATIN 40 MG PO TABS: 40.0000 mg | ORAL_TABLET | Freq: Every day | ORAL | Status: DC

## 2011-02-11 ENCOUNTER — Encounter (HOSPITAL_COMMUNITY)
Admission: RE | Admit: 2011-02-11 | Discharge: 2011-02-11 | Disposition: A | Discharge status: Home / Self Care | Payer: Medicare Other | Source: Ambulatory Visit | Attending: Cardiovascular Disease | Admitting: Cardiovascular Disease

## 2011-02-11 DIAGNOSIS — I200 Unstable angina: Secondary | ICD-10-CM | POA: Insufficient documentation

## 2011-02-11 DIAGNOSIS — E785 Hyperlipidemia, unspecified: Secondary | ICD-10-CM | POA: Insufficient documentation

## 2011-02-11 DIAGNOSIS — I2510 Atherosclerotic heart disease of native coronary artery without angina pectoris: Secondary | ICD-10-CM | POA: Insufficient documentation

## 2011-02-11 DIAGNOSIS — Z5189 Encounter for other specified aftercare: Secondary | ICD-10-CM | POA: Insufficient documentation

## 2011-02-11 DIAGNOSIS — Z85038 Personal history of other malignant neoplasm of large intestine: Secondary | ICD-10-CM | POA: Insufficient documentation

## 2011-02-11 DIAGNOSIS — I519 Heart disease, unspecified: Secondary | ICD-10-CM | POA: Insufficient documentation

## 2011-02-11 DIAGNOSIS — Z951 Presence of aortocoronary bypass graft: Secondary | ICD-10-CM | POA: Insufficient documentation

## 2011-02-11 DIAGNOSIS — Z87891 Personal history of nicotine dependence: Secondary | ICD-10-CM | POA: Insufficient documentation

## 2011-02-13 ENCOUNTER — Encounter (HOSPITAL_COMMUNITY): Payer: Medicare Other

## 2011-02-13 ENCOUNTER — Other Ambulatory Visit: Payer: Self-pay | Admitting: *Deleted

## 2011-02-13 ENCOUNTER — Other Ambulatory Visit (INDEPENDENT_AMBULATORY_CARE_PROVIDER_SITE_OTHER): Payer: Medicare Other | Admitting: *Deleted

## 2011-02-13 ENCOUNTER — Telehealth: Payer: Self-pay | Admitting: Cardiovascular Disease

## 2011-02-13 DIAGNOSIS — R002 Palpitations: Secondary | ICD-10-CM

## 2011-02-13 LAB — BASIC METABOLIC PANEL
CO2: 29 mEq/L (ref 19–32)
Calcium: 9.9 mg/dL (ref 8.4–10.5)
Sodium: 139 mEq/L (ref 135–145)

## 2011-02-13 LAB — MAGNESIUM: Magnesium: 2.3 mg/dL (ref 1.5–2.5)

## 2011-02-13 MED ORDER — METOPROLOL TARTRATE 25 MG PO TABS: 25.0000 mg | ORAL_TABLET | Freq: Two times a day (BID) | ORAL | Status: DC

## 2011-02-13 NOTE — Telephone Encounter (Signed)
Lab ordered placed today for bme and mag. Sent in new rx for metoprolol tart 25 mg 1 tab bid. Danielle Rankin Med sent

## 2011-02-13 NOTE — Telephone Encounter (Signed)
Pt calling stating that he needed to come in today to get lab work. Pt wanted to make sure he still needed to come in and and to inform nurse if needed pt can come in today. Please return pt call to discuss further.

## 2011-02-15 ENCOUNTER — Encounter (HOSPITAL_COMMUNITY): Payer: Medicare Other

## 2011-02-18 ENCOUNTER — Encounter (HOSPITAL_COMMUNITY): Payer: Medicare Other

## 2011-02-20 ENCOUNTER — Encounter (HOSPITAL_COMMUNITY): Payer: Medicare Other

## 2011-02-22 ENCOUNTER — Encounter (HOSPITAL_COMMUNITY): Payer: Medicare Other

## 2011-02-25 ENCOUNTER — Encounter (HOSPITAL_COMMUNITY): Payer: Medicare Other

## 2011-02-27 ENCOUNTER — Encounter (HOSPITAL_COMMUNITY): Payer: Medicare Other

## 2011-03-01 ENCOUNTER — Encounter (HOSPITAL_COMMUNITY): Payer: Medicare Other

## 2011-03-04 ENCOUNTER — Encounter (HOSPITAL_COMMUNITY): Payer: Medicare Other

## 2011-03-06 ENCOUNTER — Encounter (HOSPITAL_COMMUNITY): Payer: Medicare Other

## 2011-03-08 ENCOUNTER — Encounter (HOSPITAL_COMMUNITY): Payer: Medicare Other

## 2011-03-11 ENCOUNTER — Encounter (HOSPITAL_COMMUNITY): Payer: Medicare Other

## 2011-03-13 ENCOUNTER — Encounter (HOSPITAL_COMMUNITY): Payer: Medicare Other

## 2011-03-15 ENCOUNTER — Encounter (HOSPITAL_COMMUNITY): Payer: Medicare Other

## 2011-03-15 DIAGNOSIS — I519 Heart disease, unspecified: Secondary | ICD-10-CM | POA: Insufficient documentation

## 2011-03-15 DIAGNOSIS — Z87891 Personal history of nicotine dependence: Secondary | ICD-10-CM | POA: Insufficient documentation

## 2011-03-15 DIAGNOSIS — I200 Unstable angina: Secondary | ICD-10-CM | POA: Insufficient documentation

## 2011-03-15 DIAGNOSIS — Z951 Presence of aortocoronary bypass graft: Secondary | ICD-10-CM | POA: Insufficient documentation

## 2011-03-15 DIAGNOSIS — Z5189 Encounter for other specified aftercare: Secondary | ICD-10-CM | POA: Insufficient documentation

## 2011-03-15 DIAGNOSIS — I2510 Atherosclerotic heart disease of native coronary artery without angina pectoris: Secondary | ICD-10-CM | POA: Insufficient documentation

## 2011-03-15 DIAGNOSIS — E785 Hyperlipidemia, unspecified: Secondary | ICD-10-CM | POA: Insufficient documentation

## 2011-03-15 DIAGNOSIS — Z85038 Personal history of other malignant neoplasm of large intestine: Secondary | ICD-10-CM | POA: Insufficient documentation

## 2011-03-18 ENCOUNTER — Encounter (HOSPITAL_COMMUNITY): Payer: Medicare Other

## 2011-03-18 NOTE — Progress Notes (Signed)
1139 03/18/2011 Nutrition Note  Spoke with pt.  Nutrition Plan and Nutrition Survey reviewed with pt.  Wt loss tips discussed.  Pt wants to lose 10#, which is appropriate given pt with 28.9% body fat.  Nutrition Diagnosis   Food-and nutrition-related knowledge deficit related to lack of exposure to information as related to diagnosis of: ? CVD  Nutrition RX/ Estimated Daily Nutrition Needs for: wt loss  1300-1800 Kcal, 35-45 gm fat, 9-14 gm sat fat, 1.2-1.8 gm trans-fat, <1500 mg sodium Nutrition Intervention   Pt's individual nutrition plan including cholesterol goals reviewed with pt. - met 03/18/11   Benefits of adopting Therapeutic Lifestyle Changes discussed when Medficts reviewed. - met 03/18/11   Pt to attend the Portion Distortion class    Pt to attend the  ? Nutrition I class - met 02/19/11                         ? Nutrition II class   Pt given handouts for: ? wt loss   Continue client-centered nutrition education by RD, as part of interdisciplinary care. Goal(s)   Pt to identify food quantities necessary to achieve: ? wt loss  to a goal wt of 163# (74.1 kg) at graduation from cardiac rehab.  Monitor and Evaluate progress toward nutrition goal with team.

## 2011-03-20 ENCOUNTER — Encounter (HOSPITAL_COMMUNITY): Payer: Medicare Other

## 2011-03-22 ENCOUNTER — Encounter (HOSPITAL_COMMUNITY): Payer: Medicare Other

## 2011-03-25 ENCOUNTER — Encounter (HOSPITAL_COMMUNITY): Payer: Medicare Other

## 2011-03-27 ENCOUNTER — Encounter (HOSPITAL_COMMUNITY): Payer: Medicare Other

## 2011-03-29 ENCOUNTER — Encounter (HOSPITAL_COMMUNITY)
Admission: RE | Admit: 2011-03-29 | Discharge: 2011-03-29 | Disposition: A | Discharge status: Home / Self Care | Payer: Medicare Other | Source: Ambulatory Visit | Attending: Cardiovascular Disease | Admitting: Cardiovascular Disease

## 2011-04-01 ENCOUNTER — Encounter (HOSPITAL_COMMUNITY)
Admission: RE | Admit: 2011-04-01 | Discharge: 2011-04-01 | Disposition: A | Discharge status: Home / Self Care | Payer: Medicare Other | Source: Ambulatory Visit | Attending: Cardiovascular Disease | Admitting: Cardiovascular Disease

## 2011-04-03 ENCOUNTER — Encounter (HOSPITAL_COMMUNITY)
Admission: RE | Admit: 2011-04-03 | Discharge: 2011-04-03 | Disposition: A | Discharge status: Home / Self Care | Payer: Medicare Other | Source: Ambulatory Visit | Attending: Cardiovascular Disease | Admitting: Cardiovascular Disease

## 2011-04-05 ENCOUNTER — Encounter (HOSPITAL_COMMUNITY): Payer: Medicare Other

## 2011-04-08 ENCOUNTER — Encounter (HOSPITAL_COMMUNITY)
Admission: RE | Admit: 2011-04-08 | Discharge: 2011-04-08 | Disposition: A | Discharge status: Home / Self Care | Payer: Medicare Other | Source: Ambulatory Visit | Attending: Cardiovascular Disease | Admitting: Cardiovascular Disease

## 2011-04-10 ENCOUNTER — Encounter (HOSPITAL_COMMUNITY)
Admission: RE | Admit: 2011-04-10 | Discharge: 2011-04-10 | Disposition: A | Discharge status: Home / Self Care | Payer: Medicare Other | Source: Ambulatory Visit | Attending: Cardiovascular Disease | Admitting: Cardiovascular Disease

## 2011-04-12 ENCOUNTER — Encounter (HOSPITAL_COMMUNITY)
Admission: RE | Admit: 2011-04-12 | Discharge: 2011-04-12 | Disposition: A | Discharge status: Home / Self Care | Payer: Medicare Other | Source: Ambulatory Visit | Attending: Cardiovascular Disease | Admitting: Cardiovascular Disease

## 2011-04-15 ENCOUNTER — Encounter (HOSPITAL_COMMUNITY)
Admission: RE | Admit: 2011-04-15 | Discharge: 2011-04-15 | Disposition: A | Discharge status: Home / Self Care | Payer: Medicare Other | Source: Ambulatory Visit | Attending: Cardiovascular Disease | Admitting: Cardiovascular Disease

## 2011-04-15 DIAGNOSIS — Z87891 Personal history of nicotine dependence: Secondary | ICD-10-CM | POA: Insufficient documentation

## 2011-04-15 DIAGNOSIS — Z85038 Personal history of other malignant neoplasm of large intestine: Secondary | ICD-10-CM | POA: Insufficient documentation

## 2011-04-15 DIAGNOSIS — I519 Heart disease, unspecified: Secondary | ICD-10-CM | POA: Insufficient documentation

## 2011-04-15 DIAGNOSIS — I200 Unstable angina: Secondary | ICD-10-CM | POA: Insufficient documentation

## 2011-04-15 DIAGNOSIS — E785 Hyperlipidemia, unspecified: Secondary | ICD-10-CM | POA: Insufficient documentation

## 2011-04-15 DIAGNOSIS — Z951 Presence of aortocoronary bypass graft: Secondary | ICD-10-CM | POA: Insufficient documentation

## 2011-04-15 DIAGNOSIS — Z5189 Encounter for other specified aftercare: Secondary | ICD-10-CM | POA: Insufficient documentation

## 2011-04-15 DIAGNOSIS — I2510 Atherosclerotic heart disease of native coronary artery without angina pectoris: Secondary | ICD-10-CM | POA: Insufficient documentation

## 2011-04-17 ENCOUNTER — Encounter (HOSPITAL_COMMUNITY)
Admission: RE | Admit: 2011-04-17 | Discharge: 2011-04-17 | Disposition: A | Discharge status: Home / Self Care | Payer: Medicare Other | Source: Ambulatory Visit | Attending: Cardiovascular Disease | Admitting: Cardiovascular Disease

## 2011-04-19 ENCOUNTER — Encounter (HOSPITAL_COMMUNITY)
Admission: RE | Admit: 2011-04-19 | Discharge: 2011-04-19 | Disposition: A | Discharge status: Home / Self Care | Payer: Medicare Other | Source: Ambulatory Visit | Attending: Cardiovascular Disease | Admitting: Cardiovascular Disease

## 2011-04-22 ENCOUNTER — Encounter (HOSPITAL_COMMUNITY)
Admission: RE | Admit: 2011-04-22 | Discharge: 2011-04-22 | Disposition: A | Discharge status: Home / Self Care | Payer: Medicare Other | Source: Ambulatory Visit | Attending: Cardiovascular Disease | Admitting: Cardiovascular Disease

## 2011-04-22 ENCOUNTER — Ambulatory Visit (INDEPENDENT_AMBULATORY_CARE_PROVIDER_SITE_OTHER): Payer: Medicare Other | Admitting: Cardiovascular Disease

## 2011-04-22 ENCOUNTER — Encounter: Payer: Self-pay | Admitting: Cardiovascular Disease

## 2011-04-22 VITALS — BP 125/76 | HR 69 | Ht 70.0 in | Wt 174.0 lb

## 2011-04-22 DIAGNOSIS — E7800 Pure hypercholesterolemia, unspecified: Secondary | ICD-10-CM

## 2011-04-22 DIAGNOSIS — E785 Hyperlipidemia, unspecified: Secondary | ICD-10-CM

## 2011-04-22 DIAGNOSIS — I2510 Atherosclerotic heart disease of native coronary artery without angina pectoris: Secondary | ICD-10-CM

## 2011-04-22 LAB — HEPATIC FUNCTION PANEL
AST: 25 U/L (ref 0–37)
Albumin: 4.1 g/dL (ref 3.5–5.2)
Alkaline Phosphatase: 58 U/L (ref 39–117)
Total Protein: 7.1 g/dL (ref 6.0–8.3)

## 2011-04-22 LAB — LIPID PANEL
Cholesterol: 172 mg/dL (ref 0–200)
LDL Cholesterol: 85 mg/dL (ref 0–99)
Triglycerides: 53 mg/dL (ref 0.0–149.0)

## 2011-04-22 NOTE — Progress Notes (Signed)
History of Present Illness:77 yo WM with history of colon cancer, HLD, CAD who is here today for cardiac follow up.  In June 2012 he was cutting the grass and felt pains in both arms and heaviness in the chest. This was associated with SOB and diaphoresis. He rested and this went away. Lasted for five minutes. This occurred several more times. His wife is also my patient and survived a STEMI this spring. I set up a stress myoview. He had chest pressure at peak exercise and EKG changes c/w ischemia. His myoview images showed a large area of reversible ischemia including the septum and apex. I arranged a cardiac cath which showed severe left main and LAD ostial stenosis. He underwent 2V CABG per Dr. Laneta Simmers on 01/04/11. He has done well.   He tells me that he is feeling well. No chest pain. His breathing has been ok. NO dizziness, near syncope or syncope. He has been exercising at cardiac rehab without problems.    Past Medical History  Diagnosis Date  . History of colon cancer     Followed by Dr Sheryn Bison  . Hearing loss     wears hearing aids  . ED (erectile dysfunction)   . CAD (coronary artery disease)   . Hyperlipidemia     Past Surgical History  Procedure Date  . Partial colectomy 1995    Dr Samuella Cota  . Tympanostomy tube placement 4/09  . Appendectomy   . Tonsillectomy   . Cardiac catheterization   . Cabg x 2 01/04/2011    Dr Laneta Simmers    Current Outpatient Prescriptions  Medication Sig Dispense Refill  . aspirin 81 MG tablet Take 81 mg by mouth daily.        Marland Kitchen b complex vitamins capsule Take 1 capsule by mouth daily.        . metoprolol tartrate (LOPRESSOR) 25 MG tablet Take 1 tablet (25 mg total) by mouth 2 (two) times daily.  60 tablet  11  . Multiple Vitamin (MULTIVITAMIN) tablet Take 1 tablet by mouth daily.        . simvastatin (ZOCOR) 40 MG tablet Take 1 tablet (40 mg total) by mouth at bedtime.  30 tablet  11    No Known Allergies  History   Social History  .  Marital Status: Married    Spouse Name: N/A    Number of Children: 2  . Years of Education: 36yrs   Occupational History  . retired Occupational hygienist    Social History Main Topics  . Smoking status: Former Smoker -- 1.0 packs/day for 17 years    Types: Cigarettes    Quit date: 05/13/1976  . Smokeless tobacco: Not on file  . Alcohol Use: 4.2 oz/week    7 Glasses of wine per week  . Drug Use:   . Sexually Active:    Other Topics Concern  . Not on file   Social History Narrative  . No narrative on file    Family History  Problem Relation Age of Onset  . Heart failure Mother   . Dementia Mother   . Stroke Father 65    Review of Systems:  As stated in the HPI and otherwise negative.   BP 125/76  Pulse 69  Ht 5\' 10"  (1.778 m)  Wt 174 lb (78.926 kg)  BMI 24.97 kg/m2  Physical Examination: General: Well developed, well nourished, NAD HEENT: OP clear, mucus membranes moist SKIN: warm, dry. No rashes. Neuro: No focal deficits Musculoskeletal:  Muscle strength 5/5 all ext Psychiatric: Mood and affect normal Neck: No JVD, no carotid bruits, no thyromegaly, no lymphadenopathy. Lungs:Clear bilaterally, no wheezes, rhonci, crackles Cardiovascular: Regular rate and rhythm. No murmurs, gallops or rubs. Abdomen:Soft. Bowel sounds present. Non-tender.  Extremities: No lower extremity edema. Pulses are 2 + in the bilateral DP/PT.

## 2011-04-22 NOTE — Assessment & Plan Note (Signed)
He is on a statin. Will check lipids and LFTs.

## 2011-04-22 NOTE — Assessment & Plan Note (Signed)
Stable. Will continue current meds. Will continue statin. Will get lipids and LFTs today. BP at goal.

## 2011-04-22 NOTE — Patient Instructions (Signed)
Your physician wants you to follow-up in: 6 months.  You will receive a reminder letter in the mail two months in advance. If you don't receive a letter, please call our office to schedule the follow-up appointment.  Your physician recommends that you continue on your current medications as directed. Please refer to the Current Medication list given to you today.  You had lab work done today.  We will call you with results.

## 2011-04-23 ENCOUNTER — Other Ambulatory Visit: Payer: Self-pay | Admitting: *Deleted

## 2011-04-23 DIAGNOSIS — E785 Hyperlipidemia, unspecified: Secondary | ICD-10-CM

## 2011-04-23 MED ORDER — SIMVASTATIN 80 MG PO TABS: 80.0000 mg | ORAL_TABLET | Freq: Every day | ORAL | Status: DC

## 2011-04-24 ENCOUNTER — Encounter (HOSPITAL_COMMUNITY)
Admission: RE | Admit: 2011-04-24 | Discharge: 2011-04-24 | Disposition: A | Discharge status: Home / Self Care | Payer: Medicare Other | Source: Ambulatory Visit | Attending: Cardiovascular Disease | Admitting: Cardiovascular Disease

## 2011-04-26 ENCOUNTER — Encounter (HOSPITAL_COMMUNITY)
Admission: RE | Admit: 2011-04-26 | Discharge: 2011-04-26 | Disposition: A | Discharge status: Home / Self Care | Payer: Medicare Other | Source: Ambulatory Visit | Attending: Cardiovascular Disease | Admitting: Cardiovascular Disease

## 2011-04-29 ENCOUNTER — Encounter (HOSPITAL_COMMUNITY)
Admission: RE | Admit: 2011-04-29 | Discharge: 2011-04-29 | Disposition: A | Discharge status: Home / Self Care | Payer: Medicare Other | Source: Ambulatory Visit | Attending: Cardiovascular Disease | Admitting: Cardiovascular Disease

## 2011-05-01 ENCOUNTER — Encounter (HOSPITAL_COMMUNITY)
Admission: RE | Admit: 2011-05-01 | Discharge: 2011-05-01 | Disposition: A | Discharge status: Home / Self Care | Payer: Medicare Other | Source: Ambulatory Visit | Attending: Cardiovascular Disease | Admitting: Cardiovascular Disease

## 2011-05-03 ENCOUNTER — Encounter (HOSPITAL_COMMUNITY): Payer: Medicare Other

## 2011-05-06 ENCOUNTER — Encounter (HOSPITAL_COMMUNITY): Payer: Medicare Other

## 2011-05-08 ENCOUNTER — Encounter (HOSPITAL_COMMUNITY): Payer: Medicare Other

## 2011-05-10 ENCOUNTER — Encounter (HOSPITAL_COMMUNITY): Payer: Medicare Other

## 2011-05-13 ENCOUNTER — Encounter (HOSPITAL_COMMUNITY): Payer: Medicare Other

## 2011-05-15 ENCOUNTER — Encounter (HOSPITAL_COMMUNITY): Payer: Medicare Other

## 2011-05-17 ENCOUNTER — Encounter (HOSPITAL_COMMUNITY)
Admission: RE | Admit: 2011-05-17 | Discharge: 2011-05-17 | Disposition: A | Discharge status: Home / Self Care | Payer: Medicare Other | Source: Ambulatory Visit | Attending: Cardiovascular Disease | Admitting: Cardiovascular Disease

## 2011-05-17 DIAGNOSIS — Z85038 Personal history of other malignant neoplasm of large intestine: Secondary | ICD-10-CM | POA: Insufficient documentation

## 2011-05-17 DIAGNOSIS — I200 Unstable angina: Secondary | ICD-10-CM | POA: Insufficient documentation

## 2011-05-17 DIAGNOSIS — Z5189 Encounter for other specified aftercare: Secondary | ICD-10-CM | POA: Insufficient documentation

## 2011-05-17 DIAGNOSIS — Z87891 Personal history of nicotine dependence: Secondary | ICD-10-CM | POA: Diagnosis not present

## 2011-05-17 DIAGNOSIS — E785 Hyperlipidemia, unspecified: Secondary | ICD-10-CM | POA: Diagnosis not present

## 2011-05-17 DIAGNOSIS — I2510 Atherosclerotic heart disease of native coronary artery without angina pectoris: Secondary | ICD-10-CM | POA: Insufficient documentation

## 2011-05-17 DIAGNOSIS — I519 Heart disease, unspecified: Secondary | ICD-10-CM | POA: Insufficient documentation

## 2011-05-17 DIAGNOSIS — Z951 Presence of aortocoronary bypass graft: Secondary | ICD-10-CM | POA: Diagnosis not present

## 2011-05-20 ENCOUNTER — Encounter (HOSPITAL_COMMUNITY)
Admission: RE | Admit: 2011-05-20 | Discharge: 2011-05-20 | Disposition: A | Discharge status: Home / Self Care | Payer: Medicare Other | Source: Ambulatory Visit | Attending: Cardiovascular Disease | Admitting: Cardiovascular Disease

## 2011-05-20 DIAGNOSIS — Z87891 Personal history of nicotine dependence: Secondary | ICD-10-CM | POA: Diagnosis not present

## 2011-05-20 DIAGNOSIS — I2510 Atherosclerotic heart disease of native coronary artery without angina pectoris: Secondary | ICD-10-CM | POA: Diagnosis not present

## 2011-05-20 DIAGNOSIS — Z951 Presence of aortocoronary bypass graft: Secondary | ICD-10-CM | POA: Diagnosis not present

## 2011-05-20 DIAGNOSIS — E785 Hyperlipidemia, unspecified: Secondary | ICD-10-CM | POA: Diagnosis not present

## 2011-05-20 DIAGNOSIS — Z5189 Encounter for other specified aftercare: Secondary | ICD-10-CM | POA: Diagnosis not present

## 2011-05-20 DIAGNOSIS — I200 Unstable angina: Secondary | ICD-10-CM | POA: Diagnosis not present

## 2011-05-22 ENCOUNTER — Encounter (HOSPITAL_COMMUNITY)
Admission: RE | Admit: 2011-05-22 | Discharge: 2011-05-22 | Disposition: A | Discharge status: Home / Self Care | Payer: Medicare Other | Source: Ambulatory Visit | Attending: Cardiovascular Disease | Admitting: Cardiovascular Disease

## 2011-05-22 DIAGNOSIS — Z5189 Encounter for other specified aftercare: Secondary | ICD-10-CM | POA: Diagnosis not present

## 2011-05-22 DIAGNOSIS — Z87891 Personal history of nicotine dependence: Secondary | ICD-10-CM | POA: Diagnosis not present

## 2011-05-22 DIAGNOSIS — E785 Hyperlipidemia, unspecified: Secondary | ICD-10-CM | POA: Diagnosis not present

## 2011-05-22 DIAGNOSIS — Z951 Presence of aortocoronary bypass graft: Secondary | ICD-10-CM | POA: Diagnosis not present

## 2011-05-22 DIAGNOSIS — I200 Unstable angina: Secondary | ICD-10-CM | POA: Diagnosis not present

## 2011-05-22 DIAGNOSIS — I2510 Atherosclerotic heart disease of native coronary artery without angina pectoris: Secondary | ICD-10-CM | POA: Diagnosis not present

## 2011-05-24 ENCOUNTER — Encounter (HOSPITAL_COMMUNITY)
Admission: RE | Admit: 2011-05-24 | Discharge: 2011-05-24 | Disposition: A | Discharge status: Home / Self Care | Payer: Medicare Other | Source: Ambulatory Visit | Attending: Cardiovascular Disease | Admitting: Cardiovascular Disease

## 2011-05-24 DIAGNOSIS — I200 Unstable angina: Secondary | ICD-10-CM | POA: Diagnosis not present

## 2011-05-24 DIAGNOSIS — Z951 Presence of aortocoronary bypass graft: Secondary | ICD-10-CM | POA: Diagnosis not present

## 2011-05-24 DIAGNOSIS — Z87891 Personal history of nicotine dependence: Secondary | ICD-10-CM | POA: Diagnosis not present

## 2011-05-24 DIAGNOSIS — Z5189 Encounter for other specified aftercare: Secondary | ICD-10-CM | POA: Diagnosis not present

## 2011-05-24 DIAGNOSIS — I2510 Atherosclerotic heart disease of native coronary artery without angina pectoris: Secondary | ICD-10-CM | POA: Diagnosis not present

## 2011-05-24 DIAGNOSIS — E785 Hyperlipidemia, unspecified: Secondary | ICD-10-CM | POA: Diagnosis not present

## 2011-05-27 ENCOUNTER — Encounter (HOSPITAL_COMMUNITY)
Admission: RE | Admit: 2011-05-27 | Discharge: 2011-05-27 | Disposition: A | Discharge status: Home / Self Care | Payer: Medicare Other | Source: Ambulatory Visit | Attending: Cardiovascular Disease | Admitting: Cardiovascular Disease

## 2011-05-27 DIAGNOSIS — Z5189 Encounter for other specified aftercare: Secondary | ICD-10-CM | POA: Diagnosis not present

## 2011-05-27 DIAGNOSIS — I200 Unstable angina: Secondary | ICD-10-CM | POA: Diagnosis not present

## 2011-05-27 DIAGNOSIS — Z951 Presence of aortocoronary bypass graft: Secondary | ICD-10-CM | POA: Diagnosis not present

## 2011-05-27 DIAGNOSIS — E785 Hyperlipidemia, unspecified: Secondary | ICD-10-CM | POA: Diagnosis not present

## 2011-05-27 DIAGNOSIS — Z87891 Personal history of nicotine dependence: Secondary | ICD-10-CM | POA: Diagnosis not present

## 2011-05-27 DIAGNOSIS — I2510 Atherosclerotic heart disease of native coronary artery without angina pectoris: Secondary | ICD-10-CM | POA: Diagnosis not present

## 2011-05-29 ENCOUNTER — Encounter (HOSPITAL_COMMUNITY)
Admission: RE | Admit: 2011-05-29 | Discharge: 2011-05-29 | Disposition: A | Discharge status: Home / Self Care | Payer: Medicare Other | Source: Ambulatory Visit | Attending: Cardiovascular Disease | Admitting: Cardiovascular Disease

## 2011-05-29 DIAGNOSIS — Z5189 Encounter for other specified aftercare: Secondary | ICD-10-CM | POA: Diagnosis not present

## 2011-05-29 DIAGNOSIS — Z87891 Personal history of nicotine dependence: Secondary | ICD-10-CM | POA: Diagnosis not present

## 2011-05-29 DIAGNOSIS — E785 Hyperlipidemia, unspecified: Secondary | ICD-10-CM | POA: Diagnosis not present

## 2011-05-29 DIAGNOSIS — I200 Unstable angina: Secondary | ICD-10-CM | POA: Diagnosis not present

## 2011-05-29 DIAGNOSIS — Z951 Presence of aortocoronary bypass graft: Secondary | ICD-10-CM | POA: Diagnosis not present

## 2011-05-29 DIAGNOSIS — I2510 Atherosclerotic heart disease of native coronary artery without angina pectoris: Secondary | ICD-10-CM | POA: Diagnosis not present

## 2011-05-29 NOTE — Progress Notes (Signed)
Daniel [name] graduates from cardiac rehab today. Daniel [name] plans to continue exercise on his own.

## 2011-05-31 ENCOUNTER — Encounter (HOSPITAL_COMMUNITY): Payer: Medicare Other

## 2011-05-31 NOTE — Progress Notes (Signed)
Cardiac Rehabilitation Program Progress Report   Orientation:  02/07/2011 Graduate Date:  05/29/2011 # of sessions completed: 36/36  Cardiologist: Clifton James Family MD:  Lorette Ang Time:  0945  A.  Exercise Program:  Tolerates exercise @ 3.8 METS for 30 minutes, Bike Test Results:  Pre: 1.00 mile and Post: 1.12 miles, Improved functional capacity  12 %, No Change  muscular strength   %, No Change  flexibility  %, Improved education score 4 % and Discharged to home exercise program.  Anticipated compliance:  good  B.  Mental Health:  Good mental attitude and Quality of Life (QOL)  changes:  Overall  -6.2 %, Health/Functioning -4.1 %, Socioeconomics -9.6 %, Psych/Spiritual -5.7 %, Family -8.3 %    C.  Education/Instruction/Skills  Accurately checks own pulse.  Rest:  72  Exercise:  114, Knows THR for exercise, Uses Perceived Exertion Scale and/or Dyspnea Scale and Attended 10/13 education classes  Home exercise given: 02/25/2011  D.  Nutrition/Weight Control/Body Composition:  BMI 24.5 % Body Fat  21.6%, Patient has lost 1.0 kg and Evidence of fat weight loss 25.3%  *This section completed by Mickle Plumb, Andres Shad, RD, LDN, CDE  E.  Blood Lipids    Lab Results  Component Value Date   CHOL 172 04/22/2011     Lab Results  Component Value Date   TRIG 53.0 04/22/2011     Lab Results  Component Value Date   HDL 76.40 04/22/2011     Lab Results  Component Value Date   CHOLHDL 2 04/22/2011     No results found for this basename: LDLDIRECT      F.  Lifestyle Changes:  Making positive lifestyle changes  G.  Symptoms noted with exercise:  Asymptomatic with some ectopy noted  Report Completed By:  Hazle Nordmann   Comments:  Pt did well in program progressing from 3.3 METs to 3.8 METs.  Pt plans to continue to exercise by walking at home.  At discharge, pt rhythm was sinus.  Thanks for the referral. Fabio Pierce, MA, ACSM RCEP

## 2011-06-03 ENCOUNTER — Encounter (HOSPITAL_COMMUNITY): Payer: Medicare Other

## 2011-06-03 NOTE — Progress Notes (Signed)
Cardiac Rehabilitation Program Progress Report   Orientation:  02/07/2011 Graduate Date:  05/29/2011 # of sessions completed: 36/36  Cardiologist: Clifton James Family MD:  Lorette Ang Time:  0945  A.  Exercise Program:  Tolerates exercise @ 3.8 METS for 30 minutes, Bike Test Results:  Pre: 1.00 mile and Post: 1.12 miles, Improved functional capacity  12 %, No Change  muscular strength   %, No Change  flexibility  %, Improved education score 4 % and Discharged to home exercise program.  Anticipated compliance:  good  B.  Mental Health:  Good mental attitude and Quality of Life (QOL)  changes:  Overall  -6.2 %, Health/Functioning -4.1 %, Socioeconomics -9.6 %, Psych/Spiritual -5.7 %, Family -8.3 %    C.  Education/Instruction/Skills  Accurately checks own pulse.  Rest:  72  Exercise:  114, Knows THR for exercise, Uses Perceived Exertion Scale and/or Dyspnea Scale and Attended 10/13 education classes  Home exercise given: 02/25/2011  D.  Nutrition/Weight Control/Body Composition:  BMI 24.5 % Body Fat  21.6%, Patient has lost 1.0 kg and Evidence of fat weight loss 25.3%, Pt is following a step 2 heart healthy diet.  *This section completed by Mickle Plumb, Andres Shad, RD, LDN, CDE  E.  Blood Lipids Lab Results  Component Value Date   CHOL 172 04/22/2011   HDL 76.40 04/22/2011   LDLCALC 85 04/22/2011   TRIG 53.0 04/22/2011   CHOLHDL 2 04/22/2011  This section completed By:  Jacques Earthly  F.  Lifestyle Changes:  Making positive lifestyle changes  G.  Symptoms noted with exercise:  Asymptomatic with some ectopy noted  Comments:  Pt did well in program progressing from 3.3 METs to 3.8 METs.  Pt plans to continue to exercise by walking at home.  At discharge, pt rhythm was sinus.  Thanks for the referral. Fabio Pierce, MA, ACSM RCEP

## 2011-06-05 ENCOUNTER — Encounter (HOSPITAL_COMMUNITY): Payer: Medicare Other

## 2011-06-07 ENCOUNTER — Encounter (HOSPITAL_COMMUNITY): Payer: Medicare Other

## 2011-06-10 ENCOUNTER — Encounter (HOSPITAL_COMMUNITY): Payer: Medicare Other

## 2011-06-12 ENCOUNTER — Encounter (HOSPITAL_COMMUNITY): Payer: Medicare Other

## 2011-06-14 ENCOUNTER — Encounter (HOSPITAL_COMMUNITY): Payer: Medicare Other

## 2011-06-27 ENCOUNTER — Encounter (HOSPITAL_COMMUNITY): Payer: Self-pay | Admitting: *Deleted

## 2011-06-27 NOTE — Progress Notes (Signed)
Agree with progress report from cardiac rehab on 06/03/2011 written by Trevor Mace and and Heloise Purpura.

## 2011-07-15 ENCOUNTER — Other Ambulatory Visit (INDEPENDENT_AMBULATORY_CARE_PROVIDER_SITE_OTHER): Payer: Medicare Other

## 2011-07-15 DIAGNOSIS — E785 Hyperlipidemia, unspecified: Secondary | ICD-10-CM

## 2011-07-15 LAB — LIPID PANEL
Cholesterol: 136 mg/dL (ref 0–200)
HDL: 69.5 mg/dL (ref >39.00)
VLDL: 8.2 mg/dL (ref 0.0–40.0)

## 2011-07-15 LAB — HEPATIC FUNCTION PANEL
Albumin: 3.8 g/dL (ref 3.5–5.2)
Alkaline Phosphatase: 54 U/L (ref 39–117)

## 2011-07-16 ENCOUNTER — Telehealth: Payer: Self-pay | Admitting: Cardiovascular Disease

## 2011-07-16 NOTE — Telephone Encounter (Signed)
Copy of lipid and liver profile mailed to pt

## 2011-07-16 NOTE — Telephone Encounter (Signed)
Pt rtn call to pat re lab work, told him lipids are at goal and lft's are normal, pt requesting copy mailed to him

## 2011-07-23 ENCOUNTER — Other Ambulatory Visit: Payer: Medicare Other

## 2011-12-04 DIAGNOSIS — Z131 Encounter for screening for diabetes mellitus: Secondary | ICD-10-CM | POA: Diagnosis not present

## 2011-12-04 DIAGNOSIS — Z0000 Encounter for general adult medical examination without abnormal findings: Secondary | ICD-10-CM | POA: Diagnosis not present

## 2011-12-18 DIAGNOSIS — D485 Neoplasm of uncertain behavior of skin: Secondary | ICD-10-CM | POA: Diagnosis not present

## 2011-12-30 ENCOUNTER — Encounter: Payer: Self-pay | Admitting: Cardiovascular Disease

## 2011-12-30 ENCOUNTER — Ambulatory Visit (INDEPENDENT_AMBULATORY_CARE_PROVIDER_SITE_OTHER): Payer: Medicare Other | Admitting: Cardiovascular Disease

## 2011-12-30 VITALS — BP 121/72 | HR 52 | Ht 70.0 in | Wt 175.1 lb

## 2011-12-30 DIAGNOSIS — E785 Hyperlipidemia, unspecified: Secondary | ICD-10-CM | POA: Diagnosis not present

## 2011-12-30 DIAGNOSIS — I2510 Atherosclerotic heart disease of native coronary artery without angina pectoris: Secondary | ICD-10-CM

## 2011-12-30 MED ORDER — SIMVASTATIN 40 MG PO TABS: 40.0000 mg | ORAL_TABLET | Freq: Every day | ORAL | Status: DC

## 2011-12-30 NOTE — Assessment & Plan Note (Addendum)
Stable. Will d/c Lopressor with bradycardia and see if this improves his energy level. Will continue ASA. Will lower Zocor to 40 mg po QHS. Recheck lipids and LFTs in 6 months.

## 2011-12-30 NOTE — Patient Instructions (Signed)
Your physician wants you to follow-up in:  6 months. You will receive a reminder letter in the mail two months in advance. If you don't receive a letter, please call our office to schedule the follow-up appointment.  Your physician has recommended you make the following change in your medication: Stop metoprolol.  Decrease simvastatin to 40 mg by mouth daily.  Your physician recommends that you return for fasting lab work in:6 months on day of appt with Dr. Clifton James

## 2011-12-30 NOTE — Progress Notes (Signed)
History of Present Illness: 78 yo WM with history of colon cancer, HLD, CAD who is here today for cardiac follow up. In June 2012 he was cutting the grass and felt pains in both arms and heaviness in the chest. This was associated with SOB and diaphoresis. He rested and this went away. Lasted for five minutes. This occurred several more times. I set up a stress myoview. He had chest pressure at peak exercise and EKG changes c/w ischemia. His myoview images showed a large area of reversible ischemia including the septum and apex. I arranged a cardiac cath which showed severe left main and LAD ostial stenosis. He underwent 2V CABG per Dr. Laneta Simmers on 01/04/11. He has done well.   He tells me that he is feeling well. No chest pain. His breathing has been ok. NO dizziness, near syncope or syncope. He does note fatigue and lack of energy. HR has been in the 50s. He has been exercising at cardiac rehab without problems.    Primary Care Physician: Daniel [name]  Last Lipid Profile:  Lipid Panel     Component Value Date/Time   CHOL 136 07/15/2011 0837   TRIG 41.0 07/15/2011 0837   HDL 69.50 07/15/2011 0837   CHOLHDL 2 07/15/2011 0837   VLDL 8.2 07/15/2011 0837   LDLCALC 58 07/15/2011 0837     Past Medical History  Diagnosis Date  . History of colon cancer     Followed by Dr Sheryn Bison  . Hearing loss     wears hearing aids  . ED (erectile dysfunction)   . CAD (coronary artery disease)     2V CABG September 2012  . Hyperlipidemia     Past Surgical History  Procedure Date  . Partial colectomy 1995    Dr Samuella Cota  . Tympanostomy tube placement 4/09  . Appendectomy   . Tonsillectomy   . Cardiac catheterization   . Cabg x 2 01/04/2011    Dr Laneta Simmers    Current Outpatient Prescriptions  Medication Sig Dispense Refill  . aspirin 81 MG tablet Take 81 mg by mouth daily.        Marland Kitchen b complex vitamins capsule Take 1 capsule by mouth daily.        . metoprolol tartrate (LOPRESSOR) 25 MG tablet Take 1  tablet (25 mg total) by mouth 2 (two) times daily.  60 tablet  11  . Multiple Vitamin (MULTIVITAMIN) tablet Take 1 tablet by mouth daily.        . simvastatin (ZOCOR) 80 MG tablet Take 1 tablet (80 mg total) by mouth at bedtime.  30 tablet  11    No Known Allergies  History   Social History  . Marital Status: Married    Spouse Name: N/A    Number of Children: 2  . Years of Education: 53yrs   Occupational History  . retired Occupational hygienist    Social History Main Topics  . Smoking status: Former Smoker -- 1.0 packs/day for 17 years    Types: Cigarettes    Quit date: 05/13/1976  . Smokeless tobacco: Not on file  . Alcohol Use: 4.2 oz/week    7 Glasses of wine per week  . Drug Use:   . Sexually Active:    Other Topics Concern  . Not on file   Social History Narrative  . No narrative on file    Family History  Problem Relation Age of Onset  . Heart failure Mother   . Dementia Mother   .  Stroke Father 78    Review of Systems:  As stated in the HPI and otherwise negative.   BP 121/72  Pulse 52  Ht 5\' 10"  (1.778 m)  Wt 175 lb 1.9 oz (79.434 kg)  BMI 25.13 kg/m2  Physical Examination: General: Well developed, well nourished, NAD HEENT: OP clear, mucus membranes moist SKIN: warm, dry. No rashes. Neuro: No focal deficits Musculoskeletal: Muscle strength 5/5 all ext Psychiatric: Mood and affect normal Neck: No JVD, no carotid bruits, no thyromegaly, no lymphadenopathy. Lungs:Clear bilaterally, no wheezes, rhonci, crackles Cardiovascular: Regular rate and rhythm. No murmurs, gallops or rubs. Abdomen:Soft. Bowel sounds present. Non-tender.  Extremities: No lower extremity edema. Pulses are 2 + in the bilateral DP/PT.  EKG: Sinus brady, rate 52 bpm.

## 2012-06-30 ENCOUNTER — Other Ambulatory Visit (INDEPENDENT_AMBULATORY_CARE_PROVIDER_SITE_OTHER): Payer: Medicare Other

## 2012-06-30 ENCOUNTER — Encounter: Payer: Self-pay | Admitting: Cardiovascular Disease

## 2012-06-30 ENCOUNTER — Ambulatory Visit (INDEPENDENT_AMBULATORY_CARE_PROVIDER_SITE_OTHER): Payer: Medicare Other | Admitting: Cardiovascular Disease

## 2012-06-30 VITALS — BP 128/78 | HR 68 | Ht 70.0 in | Wt 180.6 lb

## 2012-06-30 DIAGNOSIS — I2510 Atherosclerotic heart disease of native coronary artery without angina pectoris: Secondary | ICD-10-CM | POA: Diagnosis not present

## 2012-06-30 DIAGNOSIS — R0989 Other specified symptoms and signs involving the circulatory and respiratory systems: Secondary | ICD-10-CM

## 2012-06-30 LAB — HEPATIC FUNCTION PANEL
Alkaline Phosphatase: 50 U/L (ref 39–117)
Bilirubin, Direct: 0.1 mg/dL (ref 0.0–0.3)
Total Bilirubin: 0.5 mg/dL (ref 0.3–1.2)

## 2012-06-30 LAB — LIPID PANEL
LDL Cholesterol: 67 mg/dL (ref 0–99)
Total CHOL/HDL Ratio: 2

## 2012-06-30 NOTE — Progress Notes (Signed)
History of Present Illness: [name] with history of colon cancer, HLD, CAD who is here today for cardiac follow up. In June 2012 he was cutting the grass and felt pains in both arms and heaviness in the chest. This was associated with SOB and diaphoresis. He rested and this went away. Lasted for five minutes. This occurred several more times. I set up a stress myoview. He had chest pressure at peak exercise and EKG changes c/w ischemia. His myoview images showed a large area of reversible ischemia including the septum and apex. I arranged a cardiac cath which showed severe left main and LAD ostial stenosis. He underwent 2V CABG per Dr. Laneta Simmers on 01/04/11. He has done well. He was last seen in our office August 2013. Beta blocker held last visit due to fatigue.   He tells me that he is feeling well. No chest pain. His breathing has been ok. NO dizziness, near syncope or syncope.   Primary Care Physician: Marinda Elk   Last Lipid Profile:Lipid Panel     Component Value Date/Time   CHOL 136 07/15/2011 0837   TRIG 41.0 07/15/2011 0837   HDL 69.50 07/15/2011 0837   CHOLHDL 2 07/15/2011 0837   VLDL 8.2 07/15/2011 0837   LDLCALC 58 07/15/2011 0837     Past Medical History  Diagnosis Date  . History of colon cancer     Followed by Dr Sheryn Bison  . Hearing loss     wears hearing aids  . ED (erectile dysfunction)   . CAD (coronary artery disease)     2V CABG September 2012  . Hyperlipidemia     Past Surgical History  Procedure Laterality Date  . Partial colectomy  1995    Dr Samuella Cota  . Tympanostomy tube placement  4/09  . Appendectomy    . Tonsillectomy    . Cardiac catheterization    . Cabg x 2  01/04/2011    Dr Laneta Simmers    Current Outpatient Prescriptions  Medication Sig Dispense Refill  . aspirin 81 MG tablet Take 81 mg by mouth daily.        Marland Kitchen b complex vitamins capsule Take 1 capsule by mouth daily.        . Multiple Vitamin (MULTIVITAMIN) tablet Take 1 tablet by mouth daily.          . simvastatin (ZOCOR) 40 MG tablet Take 1 tablet (40 mg total) by mouth at bedtime.  30 tablet  6   No current facility-administered medications for this visit.    No Known Allergies  History   Social History  . Marital Status: Married    Spouse Name: N/A    Number of Children: 2  . Years of Education: 31yrs   Occupational History  . retired Occupational hygienist    Social History Main Topics  . Smoking status: Former Smoker -- 1.00 packs/day for 17 years    Types: Cigarettes    Quit date: 05/13/1976  . Smokeless tobacco: Not on file  . Alcohol Use: 4.2 oz/week    7 Glasses of wine per week  . Drug Use:   . Sexually Active:    Other Topics Concern  . Not on file   Social History Narrative  . No narrative on file    Family History  Problem Relation Age of Onset  . Heart failure Mother   . Dementia Mother   . Stroke Father 72    Review of Systems:  As stated in the HPI  and otherwise negative.   BP 128/78  Pulse 68  Ht 5\' 10"  (1.778 m)  Wt 180 lb 9.6 oz (81.92 kg)  BMI 25.91 kg/m2  Physical Examination: General: Well developed, well nourished, NAD HEENT: OP clear, mucus membranes moist SKIN: warm, dry. No rashes. Neuro: No focal deficits Musculoskeletal: Muscle strength 5/5 all ext Psychiatric: Mood and affect normal Neck: No JVD, no carotid bruits, no thyromegaly, no lymphadenopathy. Lungs:Clear bilaterally, no wheezes, rhonci, crackles Cardiovascular: Regular rate and rhythm. No murmurs, gallops or rubs. Abdomen:Soft. Bowel sounds present. Non-tender.  Extremities: No lower extremity edema. Pulses are 2 + in the bilateral DP/PT.  Assessment and Plan:   1. CAD: Stable. Beta blocker stopped secondary to bradycardia. Will continue ASA. Will continue Zocor to 40 mg po QHS. Will check lipids and LFTs today.

## 2012-06-30 NOTE — Patient Instructions (Addendum)
Your physician wants you to follow-up in:  6 months. You will receive a reminder letter in the mail two months in advance. If you don't receive a letter, please call our office to schedule the follow-up appointment.   

## 2012-08-18 ENCOUNTER — Encounter: Payer: Self-pay | Admitting: Cardiovascular Disease

## 2012-08-18 ENCOUNTER — Other Ambulatory Visit: Payer: Self-pay | Admitting: *Deleted

## 2012-08-18 DIAGNOSIS — E785 Hyperlipidemia, unspecified: Secondary | ICD-10-CM

## 2012-08-18 MED ORDER — SIMVASTATIN 40 MG PO TABS: 40.0000 mg | ORAL_TABLET | Freq: Every day | ORAL | Status: DC

## 2012-12-16 ENCOUNTER — Other Ambulatory Visit: Payer: Self-pay

## 2012-12-31 ENCOUNTER — Encounter: Payer: Self-pay | Admitting: Cardiovascular Disease

## 2012-12-31 ENCOUNTER — Ambulatory Visit (INDEPENDENT_AMBULATORY_CARE_PROVIDER_SITE_OTHER): Payer: Medicare Other | Admitting: Cardiovascular Disease

## 2012-12-31 VITALS — BP 134/86 | HR 77 | Ht 70.0 in | Wt 173.0 lb

## 2012-12-31 DIAGNOSIS — I2510 Atherosclerotic heart disease of native coronary artery without angina pectoris: Secondary | ICD-10-CM | POA: Diagnosis not present

## 2012-12-31 NOTE — Progress Notes (Signed)
History of Present Illness: [name] with history of colon cancer, HLD, CAD who is here today for cardiac follow up. In June 2012 he was cutting the grass and felt pains in both arms and heaviness in the chest. This was associated with SOB and diaphoresis. He rested and this went away. Lasted for five minutes. This occurred several more times. I set up a stress myoview. He had chest pressure at peak exercise and EKG changes c/w ischemia. His myoview images showed a large area of reversible ischemia including the septum and apex. I arranged a cardiac cath which showed severe left main and LAD ostial stenosis. He underwent 2V CABG per Dr. Laneta Simmers on 01/04/11. He has done well. He was last seen in our office February 2014.  Beta blocker has been stopped due to fatigue.   He tells me that he is feeling well. No chest pain. His breathing has been ok. No dizziness, near syncope or syncope.   Primary Care Physician: Marinda Elk   Last Lipid Profile:Lipid Panel     Component Value Date/Time   CHOL 149 06/30/2012 0914   TRIG 58.0 06/30/2012 0914   HDL 70.40 06/30/2012 0914   CHOLHDL 2 06/30/2012 0914   VLDL 11.6 06/30/2012 0914   LDLCALC 67 06/30/2012 0914     Past Medical History  Diagnosis Date  . History of colon cancer     Followed by Dr Sheryn Bison  . Hearing loss     wears hearing aids  . ED (erectile dysfunction)   . CAD (coronary artery disease)     2V CABG September 2012  . Hyperlipidemia     Past Surgical History  Procedure Laterality Date  . Partial colectomy  1995    Dr Samuella Cota  . Tympanostomy tube placement  4/09  . Appendectomy    . Tonsillectomy    . Cardiac catheterization    . Cabg x 2  01/04/2011    Dr Laneta Simmers    Current Outpatient Prescriptions  Medication Sig Dispense Refill  . aspirin 81 MG tablet Take 81 mg by mouth daily.        Marland Kitchen b complex vitamins capsule Take 1 capsule by mouth daily.        . Multiple Vitamin (MULTIVITAMIN) tablet Take 1 tablet by mouth  daily.        . simvastatin (ZOCOR) 40 MG tablet Take 1 tablet (40 mg total) by mouth at bedtime.  30 tablet  11   No current facility-administered medications for this visit.    No Known Allergies  History   Social History  . Marital Status: Married    Spouse Name: N/A    Number of Children: 2  . Years of Education: 44yrs   Occupational History  . retired Occupational hygienist    Social History Main Topics  . Smoking status: Former Smoker -- 1.00 packs/day for 17 years    Types: Cigarettes    Quit date: 05/13/1976  . Smokeless tobacco: Not on file  . Alcohol Use: 4.2 oz/week    7 Glasses of wine per week  . Drug Use:   . Sexual Activity:    Other Topics Concern  . Not on file   Social History Narrative  . No narrative on file    Family History  Problem Relation Age of Onset  . Heart failure Mother   . Dementia Mother   . Stroke Father 74    Review of Systems:  As stated in the HPI  and otherwise negative.   BP 134/86  Pulse 77  Ht 5\' 10"  (1.778 m)  Wt 173 lb (78.472 kg)  BMI 24.82 kg/m2  SpO2 96%  Physical Examination: General: Well developed, well nourished, NAD HEENT: OP clear, mucus membranes moist SKIN: warm, dry. No rashes. Neuro: No focal deficits Musculoskeletal: Muscle strength 5/5 all ext Psychiatric: Mood and affect normal Neck: No JVD, no carotid bruits, no thyromegaly, no lymphadenopathy. Lungs:Clear bilaterally, no wheezes, rhonci, crackles Cardiovascular: Regular rate and rhythm. No murmurs, gallops or rubs. Abdomen:Soft. Bowel sounds present. Non-tender.  Extremities: No lower extremity edema. Pulses are 2 + in the bilateral DP/PT.  EKG: Sinus, rate 77 bpm. PVCs. LVH. Non-specific ST abnormalities.   Assessment and Plan:   1. CAD: Stable. Beta blocker stopped secondary to bradycardia. Will continue ASA. Will continue Zocor. Lipids and BP well controlled.

## 2012-12-31 NOTE — Patient Instructions (Addendum)
Your physician wants you to follow-up in: 6 months with Dr. Clifton James.  You will receive a reminder letter in the mail two months in advance. If you don't receive a letter, please call our office to schedule the follow-up appointment.  Your physician recommends that you continue on your current medications as directed. Please refer to the Current Medication list given to you today.

## 2013-01-04 ENCOUNTER — Encounter: Payer: Self-pay | Admitting: Cardiovascular Disease

## 2013-01-04 DIAGNOSIS — Z79899 Other long term (current) drug therapy: Secondary | ICD-10-CM | POA: Diagnosis not present

## 2013-01-04 DIAGNOSIS — Z0000 Encounter for general adult medical examination without abnormal findings: Secondary | ICD-10-CM | POA: Diagnosis not present

## 2013-05-01 IMAGING — CR DG CHEST 1V PORT
1 series · 1 of 1 positions shown · non-contrast
Comparison: 01/02/2011.

CLINICAL DATA: Postoperative radiograph.  CABG.

PORTABLE CHEST - 1 VIEW

[view not recorded]
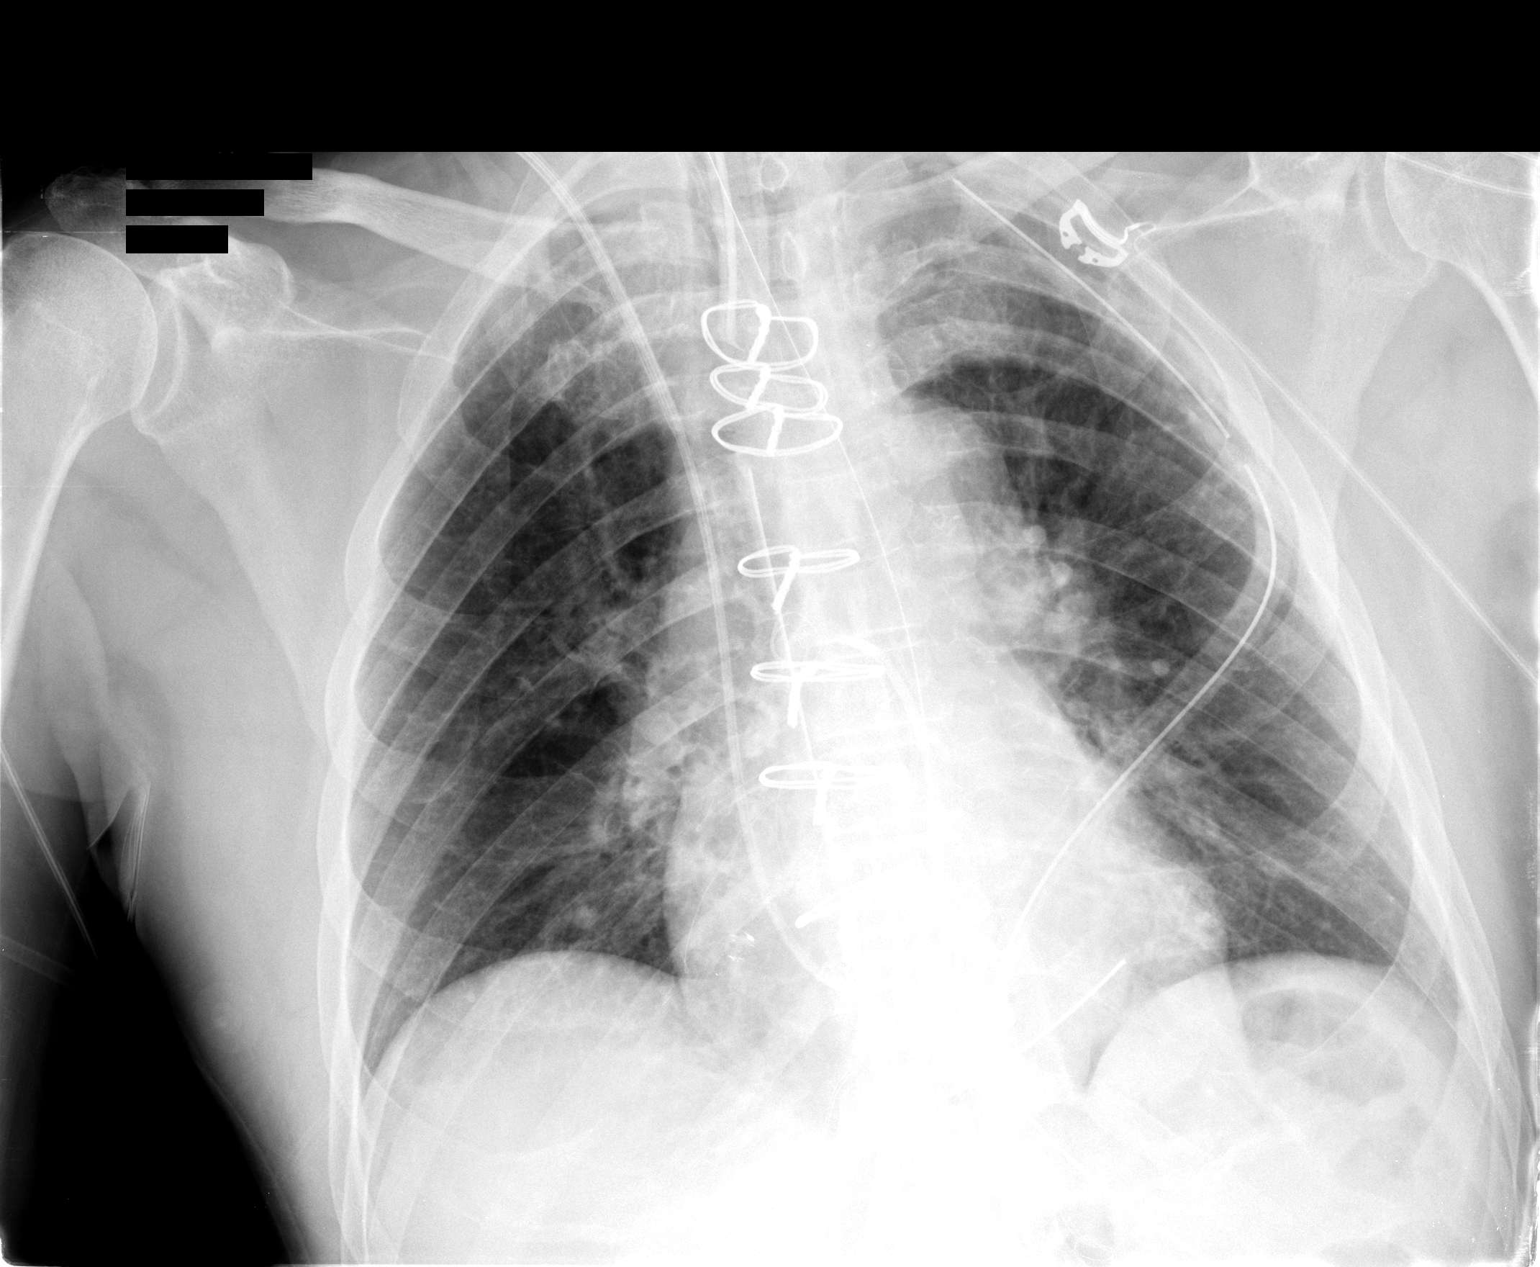

[1 of 1 positions shown; findings below may reference images not displayed]

FINDINGS: Endotracheal tube 65 mm from the carina.  Nasogastric
tube is present with the proximal side port near the
gastroesophageal junction.  Nasogastric tube should be advanced a
centimeter or two to prevent gastroesophageal reflux.  There is a
right IJ central line with a Swan-Ganz catheter.  The tip of the
Swan-Ganz catheter is in the pulmonary artery.  Median sternotomy /
CABG.  Media mediastinal drain and left thoracostomy tube.  No
pneumothorax.  Lung volumes low.
IMPRESSION: 1.  Interval CABG.
2.  Nasogastric tube side port is at the gastroesophageal junction
and should be advanced one or 2 cm for better positioning and to
prevent reflux.
3.  Other support apparatus appears in good position with the Swan-
Ganz catheter tip in the main pulmonary artery.

## 2013-06-30 ENCOUNTER — Encounter: Payer: Self-pay | Admitting: Cardiovascular Disease

## 2013-06-30 ENCOUNTER — Ambulatory Visit (INDEPENDENT_AMBULATORY_CARE_PROVIDER_SITE_OTHER): Payer: Medicare Other | Admitting: Cardiovascular Disease

## 2013-06-30 VITALS — BP 126/62 | HR 70 | Ht 70.0 in | Wt 180.0 lb

## 2013-06-30 DIAGNOSIS — E785 Hyperlipidemia, unspecified: Secondary | ICD-10-CM

## 2013-06-30 DIAGNOSIS — I2510 Atherosclerotic heart disease of native coronary artery without angina pectoris: Secondary | ICD-10-CM

## 2013-06-30 NOTE — Progress Notes (Signed)
History of Present Illness: 80 yo WM with history of colon cancer, HLD, CAD who is here today for cardiac follow up. In June 2012 he was cutting the grass and felt pains in both arms and heaviness in the chest. This was associated with SOB and diaphoresis. He rested and this went away. Lasted for five minutes. This occurred several more times. I set up a stress myoview. He had chest pressure at peak exercise and EKG changes c/w ischemia. His myoview images showed a large area of reversible ischemia including the septum and apex. I arranged a cardiac cath which showed severe left main and LAD ostial stenosis. He underwent 2V CABG per Dr. Cyndia Bent on 01/04/11.  Beta blocker has been stopped due to fatigue.   He tells me that he is feeling well. No chest pain. His breathing has been ok. No dizziness, near syncope or syncope.   Primary Care Physician: Briscoe Deutscher   Last Lipid Profile:Lipid Panel     Component Value Date/Time   CHOL 149 06/30/2012 0914   TRIG 58.0 06/30/2012 0914   HDL 70.40 06/30/2012 0914   CHOLHDL 2 06/30/2012 0914   VLDL 11.6 06/30/2012 0914   LDLCALC 67 06/30/2012 0914     Past Medical History  Diagnosis Date  . History of colon cancer     Followed by Dr Verl Blalock  . Hearing loss     wears hearing aids  . ED (erectile dysfunction)   . CAD (coronary artery disease)     2V CABG September 2012  . Hyperlipidemia     Past Surgical History  Procedure Laterality Date  . Partial colectomy  1995    Dr March Rummage  . Tympanostomy tube placement  4/09  . Appendectomy    . Tonsillectomy    . Cardiac catheterization    . Cabg x 2  01/04/2011    Dr Cyndia Bent    Current Outpatient Prescriptions  Medication Sig Dispense Refill  . aspirin 81 MG tablet Take 81 mg by mouth daily.        Marland Kitchen b complex vitamins capsule Take 1 capsule by mouth daily.        . Multiple Vitamin (MULTIVITAMIN) tablet Take 1 tablet by mouth daily.        . simvastatin (ZOCOR) 40 MG tablet Take 1 tablet  (40 mg total) by mouth at bedtime.  30 tablet  11   No current facility-administered medications for this visit.    No Known Allergies  History   Social History  . Marital Status: Married    Spouse Name: N/A    Number of Children: 2  . Years of Education: 47yrs   Occupational History  . retired Insurance underwriter    Social History Main Topics  . Smoking status: Former Smoker -- 1.00 packs/day for 17 years    Types: Cigarettes    Quit date: 05/13/1976  . Smokeless tobacco: Not on file  . Alcohol Use: 4.2 oz/week    7 Glasses of wine per week  . Drug Use:   . Sexual Activity:    Other Topics Concern  . Not on file   Social History Narrative  . No narrative on file    Family History  Problem Relation Age of Onset  . Heart failure Mother   . Dementia Mother   . Stroke Father 64    Review of Systems:  As stated in the HPI and otherwise negative.   BP 126/62  Pulse 70  Ht  5\' 10"  (1.778 m)  Wt 180 lb (81.647 kg)  BMI 25.83 kg/m2  Physical Examination: General: Well developed, well nourished, NAD HEENT: OP clear, mucus membranes moist SKIN: warm, dry. No rashes. Neuro: No focal deficits Musculoskeletal: Muscle strength 5/5 all ext Psychiatric: Mood and affect normal Neck: No JVD, no carotid bruits, no thyromegaly, no lymphadenopathy. Lungs:Clear bilaterally, no wheezes, rhonci, crackles Cardiovascular: Regular rate and rhythm. No murmurs, gallops or rubs. Abdomen:Soft. Bowel sounds present. Non-tender.  Extremities: No lower extremity edema. Pulses are 2 + in the bilateral DP/PT.  Assessment and Plan:   1. CAD: Stable. Beta blocker stopped secondary to bradycardia. Will continue ASA and Zocor. Lipids and BP well controlled.   2. HLD: Continue statin. Lipids followed in primary care.

## 2013-06-30 NOTE — Patient Instructions (Signed)
Your physician wants you to follow-up in:  6 months. You will receive a reminder letter in the mail two months in advance. If you don't receive a letter, please call our office to schedule the follow-up appointment.   

## 2013-08-05 ENCOUNTER — Other Ambulatory Visit: Payer: Self-pay | Admitting: Cardiovascular Disease

## 2013-12-30 ENCOUNTER — Ambulatory Visit (INDEPENDENT_AMBULATORY_CARE_PROVIDER_SITE_OTHER): Payer: Medicare Other | Admitting: Cardiovascular Disease

## 2013-12-30 ENCOUNTER — Encounter: Payer: Self-pay | Admitting: Cardiovascular Disease

## 2013-12-30 VITALS — BP 138/90 | HR 62 | Ht 70.0 in | Wt 180.8 lb

## 2013-12-30 DIAGNOSIS — I10 Essential (primary) hypertension: Secondary | ICD-10-CM | POA: Diagnosis not present

## 2013-12-30 DIAGNOSIS — I2510 Atherosclerotic heart disease of native coronary artery without angina pectoris: Secondary | ICD-10-CM | POA: Diagnosis not present

## 2013-12-30 DIAGNOSIS — E785 Hyperlipidemia, unspecified: Secondary | ICD-10-CM

## 2013-12-30 MED ORDER — LOSARTAN POTASSIUM 50 MG PO TABS: 50.0000 mg | ORAL_TABLET | Freq: Every day | ORAL | Status: DC

## 2013-12-30 MED ORDER — SIMVASTATIN 20 MG PO TABS: 20.0000 mg | ORAL_TABLET | Freq: Every day | ORAL | Status: DC

## 2013-12-30 MED ORDER — NITROGLYCERIN 0.4 MG SL SUBL: 0.4000 mg | SUBLINGUAL_TABLET | SUBLINGUAL | Status: DC | PRN

## 2013-12-30 NOTE — Progress Notes (Signed)
History of Present Illness: 81 yo WM with history of colon cancer, HLD, CAD who is here today for cardiac follow up. He initially presented in June 2012 with exertional dyspnea and chest pain. Stress myoview June 2012 showed a large area of reversible ischemia including the septum and apex. I arranged a cardiac cath which showed severe left main and LAD ostial stenosis. He underwent 2V CABG per Dr. Cyndia Bent on 01/04/11.  Beta blocker has been stopped due to fatigue.   He is here today for follow up. He tells me that he is feeling well. No chest pain. His breathing has been ok. No dizziness, near syncope or syncope. He does describe fatigue and thinks this is due to his statin.   Primary Care Physician: Briscoe Deutscher   Last Lipid Profile:Lipid Panel     Component Value Date/Time   CHOL 149 06/30/2012 0914   TRIG 58.0 06/30/2012 0914   HDL 70.40 06/30/2012 0914   CHOLHDL 2 06/30/2012 0914   VLDL 11.6 06/30/2012 0914   LDLCALC 67 06/30/2012 0914     Past Medical History  Diagnosis Date  . History of colon cancer     Followed by Dr Verl Blalock  . Hearing loss     wears hearing aids  . ED (erectile dysfunction)   . CAD (coronary artery disease)     2V CABG September 2012  . Hyperlipidemia     Past Surgical History  Procedure Laterality Date  . Partial colectomy  1995    Dr March Rummage  . Tympanostomy tube placement  4/09  . Appendectomy    . Tonsillectomy    . Cardiac catheterization    . Cabg x 2  01/04/2011    Dr Cyndia Bent    Current Outpatient Prescriptions  Medication Sig Dispense Refill  . aspirin 81 MG tablet Take 81 mg by mouth daily.        Marland Kitchen b complex vitamins capsule Take 1 capsule by mouth daily.        . Multiple Vitamin (MULTIVITAMIN) tablet Take 1 tablet by mouth daily.        . nitroGLYCERIN (NITROSTAT) 0.4 MG SL tablet Place 0.4 mg under the tongue every 5 (five) minutes as needed for chest pain.      . simvastatin (ZOCOR) 40 MG tablet TAKE 1 TABLET (40 MG TOTAL) BY  MOUTH AT BEDTIME.  30 tablet  5   No current facility-administered medications for this visit.    No Known Allergies  History   Social History  . Marital Status: Married    Spouse Name: N/A    Number of Children: 2  . Years of Education: 6yrs   Occupational History  . retired Insurance underwriter    Social History Main Topics  . Smoking status: Former Smoker -- 1.00 packs/day for 17 years    Types: Cigarettes    Quit date: 05/13/1976  . Smokeless tobacco: Not on file  . Alcohol Use: 4.2 oz/week    7 Glasses of wine per week  . Drug Use:   . Sexual Activity:    Other Topics Concern  . Not on file   Social History Narrative  . No narrative on file    Family History  Problem Relation Age of Onset  . Heart failure Mother   . Dementia Mother   . Stroke Father 29    Review of Systems:  As stated in the HPI and otherwise negative.   BP 138/90  Pulse 62  Ht  5\' 10"  (1.778 m)  Wt 180 lb 12.8 oz (82.01 kg)  BMI 25.94 kg/m2  Physical Examination: General: Well developed, well nourished, NAD HEENT: OP clear, mucus membranes moist SKIN: warm, dry. No rashes. Neuro: No focal deficits Musculoskeletal: Muscle strength 5/5 all ext Psychiatric: Mood and affect normal Neck: No JVD, no carotid bruits, no thyromegaly, no lymphadenopathy. Lungs:Clear bilaterally, no wheezes, rhonci, crackles Cardiovascular: Regular rate and rhythm. No murmurs, gallops or rubs. Abdomen:Soft. Bowel sounds present. Non-tender.  Extremities: No lower extremity edema. Pulses are 2 + in the bilateral DP/PT.  EKG: Sinus rhythm. PVCs. Rate 62 bpm.   Assessment and Plan:   1. CAD: Stable. No beta blocker due to bradycardia. Will continue ASA and Zocor.   2. HLD: Lipids followed in primary care.  He has some fatigue. Will lower Zocor to 20 mg per day.   3. HTN: BP controlled but borderline at home. Will consider adding Cozaar 50 mg daily. No changes.

## 2013-12-30 NOTE — Patient Instructions (Signed)
Your physician wants you to follow-up in:  6 months. You will receive a reminder letter in the mail two months in advance. If you don't receive a letter, please call our office to schedule the follow-up appointment.  Your physician has recommended you make the following change in your medication:   Decrease simvastatin to 20 mg by mouth daily. Start Cozaar 50 mg by mouth daily.

## 2014-01-06 DIAGNOSIS — Z79899 Other long term (current) drug therapy: Secondary | ICD-10-CM | POA: Diagnosis not present

## 2014-01-06 DIAGNOSIS — Z23 Encounter for immunization: Secondary | ICD-10-CM | POA: Diagnosis not present

## 2014-01-06 DIAGNOSIS — E7800 Pure hypercholesterolemia, unspecified: Secondary | ICD-10-CM | POA: Diagnosis not present

## 2014-01-06 DIAGNOSIS — Z0000 Encounter for general adult medical examination without abnormal findings: Secondary | ICD-10-CM | POA: Diagnosis not present

## 2014-01-12 ENCOUNTER — Encounter: Payer: Self-pay | Admitting: Gastroenterology

## 2014-01-19 ENCOUNTER — Encounter: Payer: Self-pay | Admitting: Gastroenterology

## 2014-03-29 ENCOUNTER — Ambulatory Visit (INDEPENDENT_AMBULATORY_CARE_PROVIDER_SITE_OTHER): Payer: Medicare Other | Admitting: Gastroenterology

## 2014-03-29 ENCOUNTER — Encounter: Payer: Self-pay | Admitting: Gastroenterology

## 2014-03-29 VITALS — BP 122/70 | HR 76 | Ht 69.0 in | Wt 180.2 lb

## 2014-03-29 DIAGNOSIS — Z85038 Personal history of other malignant neoplasm of large intestine: Secondary | ICD-10-CM | POA: Diagnosis not present

## 2014-03-29 DIAGNOSIS — I2510 Atherosclerotic heart disease of native coronary artery without angina pectoris: Secondary | ICD-10-CM | POA: Diagnosis not present

## 2014-03-29 NOTE — Progress Notes (Signed)
HPI: This is a   Very pleasant68 year old man whom I am meeting for the first time today.  Colonoscopy Dr. Verl Blalock 2010 done for history of colon cancer. This was normal except for a "scarred area in the sigmoid". He was recommended to consider repeat colonoscopy at 5 year interval.  Dr. Sharlett Iles diagnosed colon cancer around 1997, treated with surgery alone.  Had CABG 3 years ago.  Has recovered well from that.  Currently no need for blood thinners.  Has no significant trouble with his bowels.    Brother and sister had polyps.    Review of systems: Pertinent positive and negative review of systems were noted in the above HPI section. Complete review of systems was performed and was otherwise normal.    Past Medical History  Diagnosis Date  . History of colon cancer     Followed by Dr Verl Blalock  . Hearing loss     wears hearing aids  . ED (erectile dysfunction)   . CAD (coronary artery disease)     2V CABG September 2012  . Hyperlipidemia   . Colon polyps   . HTN (hypertension)     Past Surgical History  Procedure Laterality Date  . Partial colectomy  1995    Dr March Rummage  . Tympanostomy tube placement  4/09  . Appendectomy    . Tonsillectomy    . Cardiac catheterization    . Cabg x 2  01/04/2011    Dr Cyndia Bent    Current Outpatient Prescriptions  Medication Sig Dispense Refill  . aspirin 81 MG tablet Take 81 mg by mouth daily.      Marland Kitchen b complex vitamins capsule Take 1 capsule by mouth daily.      Marland Kitchen losartan (COZAAR) 50 MG tablet Take 1 tablet (50 mg total) by mouth daily. 30 tablet 11  . Multiple Vitamin (MULTIVITAMIN) tablet Take 1 tablet by mouth daily.      . nitroGLYCERIN (NITROSTAT) 0.4 MG SL tablet Place 1 tablet (0.4 mg total) under the tongue every 5 (five) minutes as needed for chest pain. 25 tablet 6  . simvastatin (ZOCOR) 20 MG tablet Take 1 tablet (20 mg total) by mouth at bedtime. 30 tablet 11   No current facility-administered medications  for this visit.    Allergies as of 03/29/2014  . (No Known Allergies)    Family History  Problem Relation Age of Onset  . Heart failure Mother   . Dementia Mother   . Stroke Father 4  . Colon polyps Brother   . Colon polyps Sister   . Parkinson's disease Brother     History   Social History  . Marital Status: Married    Spouse Name: N/A    Number of Children: 2  . Years of Education: 79yrs   Occupational History  . 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 Smoking status: Former Smoker -- 1.00 packs/day for 17 years    Types: Cigarettes    Quit date: 05/13/1976  . Smokeless tobacco: Never Used  . Alcohol Use: 4.2 oz/week    7 Glasses of wine per week  . Drug Use: No  . Sexual Activity: Not on file   Other Topics Concern  . Not on file   Social History Narrative       Physical Exam: BP 122/70 mmHg  Pulse 76  Ht 5\' 9"  (1.753 m)  Wt 180 lb 4 oz (81.761 kg)  BMI 26.61 kg/m2  Constitutional: generally well-appearing Psychiatric: alert and oriented x3 Eyes: extraocular movements intact Mouth: oral pharynx moist, no lesions Neck: supple no lymphadenopathy Cardiovascular: heart regular rate and rhythm Lungs: clear to auscultation bilaterally Abdomen: soft, nontender, nondistended, no obvious ascites, no peritoneal signs, normal bowel sounds Extremities: no lower extremity edema bilaterally Skin: no lesions on visible extremities    Assessment and plan: 80 y.o. male with  Personal history of sigmoid colon cancer 1997  Colonoscopies since his colon cancer diagnosis 18 years ago have been normal including essentially normal colonoscopy 5 years ago. He isn't good health at the age of 76. He looks his chronological age. We discussed colon cancer screening options and colon cancer screening cessation. In the end we decided to proceed with cologuard screening test and if this stool test is positive then we'll proceed with colonoscopy at his soonest  convenience. If the stool test is negative then he should forego future colon cancer screening tests.

## 2014-03-29 NOTE — Patient Instructions (Signed)
Cologuard stool test. If positive, then we'll proceed with colonoscopy.

## 2014-04-04 ENCOUNTER — Telehealth: Payer: Self-pay | Admitting: Gastroenterology

## 2014-04-04 NOTE — Telephone Encounter (Signed)
Dr Ardis Hughs the insurance will only cover cologuard if it is for screening, the pt was coded as history of colon cancer.  Can it be coded as screening?

## 2014-04-04 NOTE — Telephone Encounter (Signed)
When the pt spoke with Colog[employer] rep he advised that he has a history of polyps and cancer.  They advised the pt that because of his history medicare may not cover it but will be billed.  If not covered the pt will be responsible.  Left message on machine to call so that I can explain to the pt

## 2014-04-04 NOTE — Telephone Encounter (Signed)
Yes, it is being used as a screening test here. So screening for colon cancer is appropriate.

## 2014-04-05 NOTE — Telephone Encounter (Signed)
The pt is aware and will call [employer] and find out the possible out of pocket cost. He will decide at that point if he wants to continue or not

## 2014-04-05 NOTE — Telephone Encounter (Signed)
Left message on machine to call back  

## 2014-04-15 ENCOUNTER — Other Ambulatory Visit: Payer: Self-pay

## 2014-04-15 MED ORDER — SIMVASTATIN 20 MG PO TABS: 20.0000 mg | ORAL_TABLET | Freq: Every day | ORAL | Status: DC

## 2014-04-15 MED ORDER — LOSARTAN POTASSIUM 50 MG PO TABS: 50.0000 mg | ORAL_TABLET | Freq: Every day | ORAL | Status: DC

## 2014-04-22 ENCOUNTER — Telehealth: Payer: Self-pay | Admitting: Gastroenterology

## 2014-04-22 DIAGNOSIS — Z85038 Personal history of other malignant neoplasm of large intestine: Secondary | ICD-10-CM

## 2014-04-22 NOTE — Telephone Encounter (Signed)
Please clarify with him; I received "provider cancellation notification" from [employer].  Says that "he is not interested" in doing the test.  Ask him to clarify if that is true.  If so, then would he prefer colonoscopy for colon cancer screening, hemocult testing (2 sets) or does he prefer to not have screening at this time.

## 2014-04-25 NOTE — Telephone Encounter (Signed)
The pt would like to do 2 sets of stool cards at this time. I will mail those out today and the pt is aware to mail those back.

## 2014-05-10 ENCOUNTER — Other Ambulatory Visit (INDEPENDENT_AMBULATORY_CARE_PROVIDER_SITE_OTHER): Payer: Medicare Other

## 2014-05-10 DIAGNOSIS — Z85038 Personal history of other malignant neoplasm of large intestine: Secondary | ICD-10-CM | POA: Diagnosis not present

## 2014-05-10 LAB — HEMOCCULT SLIDES (X 3 CARDS)
FECAL OCCULT BLD: NEGATIVE
Fecal Occult Blood: NEGATIVE
OCCULT 1: NEGATIVE
OCCULT 1: NEGATIVE
OCCULT 2: NEGATIVE
OCCULT 2: NEGATIVE
OCCULT 3: NEGATIVE
OCCULT 3: NEGATIVE
OCCULT 4: NEGATIVE
OCCULT 4: NEGATIVE
OCCULT 5: NEGATIVE
OCCULT 5: NEGATIVE

## 2014-06-30 ENCOUNTER — Encounter: Payer: Self-pay | Admitting: Cardiovascular Disease

## 2014-06-30 ENCOUNTER — Ambulatory Visit (INDEPENDENT_AMBULATORY_CARE_PROVIDER_SITE_OTHER): Payer: Medicare Other | Admitting: Cardiovascular Disease

## 2014-06-30 VITALS — BP 114/60 | HR 68 | Ht 69.0 in | Wt 177.0 lb

## 2014-06-30 DIAGNOSIS — I10 Essential (primary) hypertension: Secondary | ICD-10-CM

## 2014-06-30 DIAGNOSIS — I25810 Atherosclerosis of coronary artery bypass graft(s) without angina pectoris: Secondary | ICD-10-CM

## 2014-06-30 DIAGNOSIS — E785 Hyperlipidemia, unspecified: Secondary | ICD-10-CM

## 2014-06-30 MED ORDER — NITROGLYCERIN 0.4 MG SL SUBL: 0.4000 mg | SUBLINGUAL_TABLET | SUBLINGUAL | Status: DC | PRN

## 2014-06-30 NOTE — Progress Notes (Signed)
History of Present Illness: 81 yo [name] with history of colon cancer, HLD, CAD who is here today for cardiac follow up. He initially presented in June 2012 with exertional dyspnea and chest pain. Stress myoview June 2012 showed a large area of reversible ischemia including the septum and apex. I arranged a cardiac cath which showed severe left main and LAD ostial stenosis. He underwent 2V CABG per Dr. Cyndia Bent on 01/04/11.  Beta blocker has been stopped due to fatigue.   He is here today for follow up. He tells me that he is feeling well. No chest pain or SOB. No dizziness, near syncope or syncope. He has not been exercising.   Primary Care Physician: Briscoe Deutscher   Last Lipid Profile: Followed in primary care.   Past Medical History  Diagnosis Date  . History of colon cancer     Followed by Dr Verl Blalock  . Hearing loss     wears hearing aids  . ED (erectile dysfunction)   . CAD (coronary artery disease)     2V CABG September 2012  . Hyperlipidemia   . Colon polyps   . HTN (hypertension)     Past Surgical History  Procedure Laterality Date  . Partial colectomy  1995    Dr March Rummage  . Tympanostomy tube placement  4/09  . Appendectomy    . Tonsillectomy    . Cardiac catheterization    . Cabg x 2  01/04/2011    Dr Cyndia Bent    Current Outpatient Prescriptions  Medication Sig Dispense Refill  . aspirin 81 MG tablet Take 81 mg by mouth daily.      Marland Kitchen b complex vitamins capsule Take 1 capsule by mouth daily.      Marland Kitchen losartan (COZAAR) 50 MG tablet Take 1 tablet (50 mg total) by mouth daily. 90 tablet 1  . Multiple Vitamin (MULTIVITAMIN) tablet Take 1 tablet by mouth daily.      . nitroGLYCERIN (NITROSTAT) 0.4 MG SL tablet Place 1 tablet (0.4 mg total) under the tongue every 5 (five) minutes as needed for chest pain. 25 tablet 6  . simvastatin (ZOCOR) 20 MG tablet Take 1 tablet (20 mg total) by mouth at bedtime. 90 tablet 1   No current facility-administered medications for this  visit.    No Known Allergies  History   Social History  . Marital Status: Married    Spouse Name: N/A  . Number of Children: 2  . Years of Education: 24yrs   Occupational History  . 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 Smoking status: Former Smoker -- 1.00 packs/day for 17 years    Types: Cigarettes    Quit date: 05/13/1976  . Smokeless tobacco: Never Used  . Alcohol Use: 4.2 oz/week    7 Glasses of wine per week  . Drug Use: No  . Sexual Activity: Not on file   Other Topics Concern  . Not on file   Social History Narrative    Family History  Problem Relation Age of Onset  . Heart failure Mother   . Dementia Mother   . Stroke Father 43  . Colon polyps Brother   . Colon polyps Sister   . Parkinson's disease Brother     Review of Systems:  As stated in the HPI and otherwise negative.   BP 114/60 mmHg  Pulse 68  Ht 5\' 9"  (1.753 m)  Wt 177 lb (80.287 kg)  BMI 26.13 kg/m2  Physical Examination: General: Well developed, well nourished, NAD HEENT: OP clear, mucus membranes moist SKIN: warm, dry. No rashes. Neuro: No focal deficits Musculoskeletal: Muscle strength 5/5 all ext Psychiatric: Mood and affect normal Neck: No JVD, no carotid bruits, no thyromegaly, no lymphadenopathy. Lungs:Clear bilaterally, no wheezes, rhonci, crackles Cardiovascular: Regular rate and rhythm. No murmurs, gallops or rubs. Abdomen:Soft. Bowel sounds present. Non-tender.  Extremities: No lower extremity edema. Pulses are 2 + in the bilateral DP/PT.  Assessment and Plan:   1. CAD: Stable. No beta blocker due to bradycardia. Will continue ASA and Zocor.   2. HLD: Lipids followed in primary care.  Continue statin.    3. HTN: BP controlled now on Cozaar. He wishes to try a lower dose. Will cut back to 25 mg per day and he will continue to follow at home. If it becomes elevated, will increase back to 50 mg daily.

## 2014-06-30 NOTE — Patient Instructions (Signed)
Your physician wants you to follow-up in:  6 months. You will receive a reminder letter in the mail two months in advance. If you don't receive a letter, please call our office to schedule the follow-up appointment.   

## 2014-08-02 ENCOUNTER — Other Ambulatory Visit: Payer: Self-pay | Admitting: *Deleted

## 2014-08-02 MED ORDER — LOSARTAN POTASSIUM 25 MG PO TABS: 25.0000 mg | ORAL_TABLET | Freq: Every day | ORAL | Status: DC

## 2014-08-25 ENCOUNTER — Other Ambulatory Visit: Payer: Self-pay | Admitting: Cardiovascular Disease

## 2014-10-17 DIAGNOSIS — H2513 Age-related nuclear cataract, bilateral: Secondary | ICD-10-CM | POA: Diagnosis not present

## 2014-10-17 DIAGNOSIS — H01001 Unspecified blepharitis right upper eyelid: Secondary | ICD-10-CM | POA: Diagnosis not present

## 2014-10-17 DIAGNOSIS — H01004 Unspecified blepharitis left upper eyelid: Secondary | ICD-10-CM | POA: Diagnosis not present

## 2014-11-22 ENCOUNTER — Other Ambulatory Visit: Payer: Self-pay | Admitting: Cardiovascular Disease

## 2015-01-09 ENCOUNTER — Ambulatory Visit (INDEPENDENT_AMBULATORY_CARE_PROVIDER_SITE_OTHER): Payer: Medicare Other | Admitting: Cardiovascular Disease

## 2015-01-09 ENCOUNTER — Encounter: Payer: Self-pay | Admitting: Cardiovascular Disease

## 2015-01-09 VITALS — BP 128/74 | HR 66 | Ht 69.0 in | Wt 176.2 lb

## 2015-01-09 DIAGNOSIS — I10 Essential (primary) hypertension: Secondary | ICD-10-CM

## 2015-01-09 DIAGNOSIS — I25810 Atherosclerosis of coronary artery bypass graft(s) without angina pectoris: Secondary | ICD-10-CM

## 2015-01-09 DIAGNOSIS — E785 Hyperlipidemia, unspecified: Secondary | ICD-10-CM | POA: Diagnosis not present

## 2015-01-09 DIAGNOSIS — I2510 Atherosclerotic heart disease of native coronary artery without angina pectoris: Secondary | ICD-10-CM | POA: Diagnosis not present

## 2015-01-09 NOTE — Progress Notes (Signed)
Chief Complaint  Patient presents with  . Follow-up     History of Present Illness: 81 yo WM with history of colon cancer, HLD, CAD who is here today for cardiac follow up. He initially presented in June 2012 with exertional dyspnea and chest pain. Stress myoview June 2012 showed a large area of reversible ischemia including the septum and apex. I arranged a cardiac cath which showed severe left main and LAD ostial stenosis. He underwent 2V CABG per Dr. Cyndia Bent on 01/04/11.  Beta blocker has been stopped due to fatigue.   He is here today for follow up. He tells me that he is feeling well. No chest pain or SOB. No dizziness, near syncope or syncope. He has not been exercising.   Primary Care Physician: Briscoe Deutscher   Last Lipid Profile: Followed in primary care.   Past Medical History  Diagnosis Date  . History of colon cancer     Followed by Dr Verl Blalock  . Hearing loss     wears hearing aids  . ED (erectile dysfunction)   . CAD (coronary artery disease)     2V CABG September 2012  . Hyperlipidemia   . Colon polyps   . HTN (hypertension)     Past Surgical History  Procedure Laterality Date  . Partial colectomy  1995    Dr March Rummage  . Tympanostomy tube placement  4/09  . Appendectomy    . Tonsillectomy    . Cardiac catheterization    . Cabg x 2  01/04/2011    Dr Cyndia Bent    Current Outpatient Prescriptions  Medication Sig Dispense Refill  . aspirin 81 MG tablet Take 81 mg by mouth daily.      Marland Kitchen b complex vitamins capsule Take 1 capsule by mouth daily.      Marland Kitchen losartan (COZAAR) 25 MG tablet Take 1 tablet (25 mg total) by mouth daily. 90 tablet 3  . Multiple Vitamin (MULTIVITAMIN) tablet Take 1 tablet by mouth daily.      . nitroGLYCERIN (NITROSTAT) 0.4 MG SL tablet Place 1 tablet (0.4 mg total) under the tongue every 5 (five) minutes as needed for chest pain. 25 tablet 6  . simvastatin (ZOCOR) 20 MG tablet TAKE 1 TABLET AT BEDTIME 90 tablet 0   No current  facility-administered medications for this visit.    No Known Allergies  Social History   Social History  . Marital Status: Married    Spouse Name: N/A  . Number of Children: 2  . Years of Education: 51yrs   Occupational History  . 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 Smoking status: Former Smoker -- 1.00 packs/day for 17 years    Types: Cigarettes    Quit date: 05/13/1976  . Smokeless tobacco: Never Used  . Alcohol Use: 4.2 oz/week    7 Glasses of wine per week  . Drug Use: No  . Sexual Activity: Not on file   Other Topics Concern  . Not on file   Social History Narrative    Family History  Problem Relation Age of Onset  . Heart failure Mother   . Dementia Mother   . Stroke Father 75  . Colon polyps Brother   . Colon polyps Sister   . Parkinson's disease Brother     Review of Systems:  As stated in the HPI and otherwise negative.   BP 128/74 mmHg  Pulse 66  Ht 5\' 9"  (1.753 m)  Wt  176 lb 3.2 oz (79.924 kg)  BMI 26.01 kg/m2  SpO2 98%  Physical Examination: General: Well developed, well nourished, NAD HEENT: OP clear, mucus membranes moist SKIN: warm, dry. No rashes. Neuro: No focal deficits Musculoskeletal: Muscle strength 5/5 all ext Psychiatric: Mood and affect normal Neck: No JVD, no carotid bruits, no thyromegaly, no lymphadenopathy. Lungs:Clear bilaterally, no wheezes, rhonci, crackles Cardiovascular: Regular rate and rhythm. No murmurs, gallops or rubs. Abdomen:Soft. Bowel sounds present. Non-tender.  Extremities: No lower extremity edema. Pulses are 2 + in the bilateral DP/PT.  EKG:  EKG is ordered today. The ekg ordered today demonstrates sinus, rate 66 bpm. PVCs. LAE.   Recent Labs: No results found for requested labs within last 365 days.   Lipid Panel    Component Value Date/Time   CHOL 149 06/30/2012 0914   TRIG 58.0 06/30/2012 0914   HDL 70.40 06/30/2012 0914   CHOLHDL 2 06/30/2012 0914   VLDL 11.6 06/30/2012  0914   LDLCALC 67 06/30/2012 0914     Wt Readings from Last 3 Encounters:  01/09/15 176 lb 3.2 oz (79.924 kg)  06/30/14 177 lb (80.287 kg)  03/29/14 180 lb 4 oz (81.761 kg)     Other studies Reviewed: Additional studies/ records that were reviewed today include: . Review of the above records demonstrates:    Assessment and Plan:   1. CAD: Stable. No beta blocker due to bradycardia. Will continue ASA and Zocor.   2. HLD: Lipids followed in primary care.  Continue statin.    3. HTN: BP controlled on current dose of Cozaar. No changes.   Current medicines are reviewed at length with the patient today.  The patient does not have concerns regarding medicines.  The following changes have been made:  Stop amiodarone  Labs/ tests ordered today include:   Orders Placed This Encounter  Procedures  . EKG 12-Lead    Disposition:   FU with me in 6 months  Signed, Lauree Chandler, MD 01/09/2015 3:50 PM    [facility] [address], [place], [place]  [address] Phone: [phone]; Fax: [phone]

## 2015-01-09 NOTE — Patient Instructions (Signed)
Medication Instructions:  Your physician recommends that you continue on your current medications as directed. Please refer to the Current Medication list given to you today.   Labwork: none  Testing/Procedures: none  Follow-Up: Your physician wants you to follow-up in: 6 months.  You will receive a reminder letter in the mail two months in advance. If you don't receive a letter, please call our office to schedule the follow-up appointment.       

## 2015-01-10 DIAGNOSIS — Z23 Encounter for immunization: Secondary | ICD-10-CM | POA: Diagnosis not present

## 2015-01-10 DIAGNOSIS — I2510 Atherosclerotic heart disease of native coronary artery without angina pectoris: Secondary | ICD-10-CM | POA: Diagnosis not present

## 2015-01-10 DIAGNOSIS — E780 Pure hypercholesterolemia: Secondary | ICD-10-CM | POA: Diagnosis not present

## 2015-01-10 DIAGNOSIS — H903 Sensorineural hearing loss, bilateral: Secondary | ICD-10-CM | POA: Diagnosis not present

## 2015-01-10 DIAGNOSIS — Z0000 Encounter for general adult medical examination without abnormal findings: Secondary | ICD-10-CM | POA: Diagnosis not present

## 2015-01-10 DIAGNOSIS — I10 Essential (primary) hypertension: Secondary | ICD-10-CM | POA: Diagnosis not present

## 2015-01-13 ENCOUNTER — Encounter: Payer: Self-pay | Admitting: Cardiovascular Disease

## 2015-01-25 DIAGNOSIS — H903 Sensorineural hearing loss, bilateral: Secondary | ICD-10-CM | POA: Diagnosis not present

## 2015-02-19 ENCOUNTER — Other Ambulatory Visit: Payer: Self-pay | Admitting: Cardiovascular Disease

## 2015-06-16 ENCOUNTER — Encounter: Payer: Self-pay | Admitting: Gastroenterology

## 2015-07-09 ENCOUNTER — Other Ambulatory Visit: Payer: Self-pay | Admitting: Cardiovascular Disease

## 2015-07-31 ENCOUNTER — Ambulatory Visit (INDEPENDENT_AMBULATORY_CARE_PROVIDER_SITE_OTHER): Payer: Medicare Other | Admitting: Cardiovascular Disease

## 2015-07-31 ENCOUNTER — Encounter: Payer: Self-pay | Admitting: Cardiovascular Disease

## 2015-07-31 VITALS — BP 110/74 | HR 56 | Ht 69.0 in | Wt 177.8 lb

## 2015-07-31 DIAGNOSIS — I2510 Atherosclerotic heart disease of native coronary artery without angina pectoris: Secondary | ICD-10-CM | POA: Diagnosis not present

## 2015-07-31 DIAGNOSIS — E785 Hyperlipidemia, unspecified: Secondary | ICD-10-CM | POA: Diagnosis not present

## 2015-07-31 DIAGNOSIS — I10 Essential (primary) hypertension: Secondary | ICD-10-CM

## 2015-07-31 NOTE — Progress Notes (Signed)
Chief Complaint  Patient presents with  . Follow-up     History of Present Illness: 82 yo WM with history of colon cancer, HLD, CAD who is here today for cardiac follow up. He initially presented in June 2012 with exertional dyspnea and chest pain. Stress myoview June 2012 showed a large area of reversible ischemia including the septum and apex. I arranged a cardiac cath which showed severe left main and LAD ostial stenosis. He underwent 2V CABG per Dr. Cyndia Bent on 01/04/11.  He has not tolerated beta blockers due to fatigue.   He is here today for follow up. He tells me that he is feeling well. No chest pain or SOB. No dizziness, near syncope or syncope. He has not been exercising.   Primary Care Physician: Orpah Melter, MD  Last Lipid Profile: Followed in primary care.   Past Medical History  Diagnosis Date  . History of colon cancer     Followed by Dr Verl Blalock  . Hearing loss     wears hearing aids  . ED (erectile dysfunction)   . CAD (coronary artery disease)     2V CABG September 2012  . Hyperlipidemia   . Colon polyps   . HTN (hypertension)     Past Surgical History  Procedure Laterality Date  . Partial colectomy  1995    Dr March Rummage  . Tympanostomy tube placement  4/09  . Appendectomy    . Tonsillectomy    . Cardiac catheterization    . Cabg x 2  01/04/2011    Dr Cyndia Bent    Current Outpatient Prescriptions  Medication Sig Dispense Refill  . aspirin 81 MG tablet Take 81 mg by mouth daily.      Marland Kitchen b complex vitamins capsule Take 1 capsule by mouth daily.      Marland Kitchen losartan (COZAAR) 25 MG tablet Take 1 tablet (25 mg total) by mouth daily. 90 tablet 1  . Multiple Vitamin (MULTIVITAMIN) tablet Take 1 tablet by mouth daily.      . nitroGLYCERIN (NITROSTAT) 0.4 MG SL tablet Place 1 tablet (0.4 mg total) under the tongue every 5 (five) minutes as needed for chest pain. 25 tablet 6  . simvastatin (ZOCOR) 20 MG tablet Take 20 mg by mouth daily.     No current  facility-administered medications for this visit.    No Known Allergies  Social History   Social History  . Marital Status: Married    Spouse Name: N/A  . Number of Children: 2  . Years of Education: 89yrs   Occupational History  . 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 Smoking status: Former Smoker -- 1.00 packs/day for 17 years    Types: Cigarettes    Quit date: 05/13/1976  . Smokeless tobacco: Never Used  . Alcohol Use: 4.2 oz/week    7 Glasses of wine per week  . Drug Use: No  . Sexual Activity: Not on file   Other Topics Concern  . Not on file   Social History Narrative    Family History  Problem Relation Age of Onset  . Heart failure Mother   . Dementia Mother   . Stroke Father 52  . Colon polyps Brother   . Colon polyps Sister   . Parkinson's disease Brother     Review of Systems:  As stated in the HPI and otherwise negative.   BP 110/74 mmHg  Pulse 56  Ht 5\' 9"  (1.753 m)  Wt 177 lb 12.8 oz (80.65 kg)  BMI 26.24 kg/m2  Physical Examination: General: Well developed, well nourished, NAD HEENT: OP clear, mucus membranes moist SKIN: warm, dry. No rashes. Neuro: No focal deficits Musculoskeletal: Muscle strength 5/5 all ext Psychiatric: Mood and affect normal Neck: No JVD, no carotid bruits, no thyromegaly, no lymphadenopathy. Lungs:Clear bilaterally, no wheezes, rhonci, crackles Cardiovascular: Regular rate and rhythm. No murmurs, gallops or rubs. Abdomen:Soft. Bowel sounds present. Non-tender.  Extremities: No lower extremity edema. Pulses are 2 + in the bilateral DP/PT.  EKG:  EKG is not ordered today. The ekg ordered today demonstrates   Recent Labs: No results found for requested labs within last 365 days.   Lipid Panel Followed in primary care   Wt Readings from Last 3 Encounters:  07/31/15 177 lb 12.8 oz (80.65 kg)  01/09/15 176 lb 3.2 oz (79.924 kg)  06/30/14 177 lb (80.287 kg)     Other studies  Reviewed: Additional studies/ records that were reviewed today include: . Review of the above records demonstrates:    Assessment and Plan:   1. CAD: Stable. No beta blocker due to bradycardia. Will continue ASA and Zocor.   2. HLD: Lipids followed in primary care.  Continue statin.    3. HTN: BP controlled on current dose of Cozaar. No changes.   Current medicines are reviewed at length with the patient today.  The patient does not have concerns regarding medicines.  The following changes have been made:  Stop amiodarone  Labs/ tests ordered today include:   No orders of the defined types were placed in this encounter.    Disposition:   FU with me in 6 months  Signed, Lauree Chandler, MD 07/31/2015 9:30 AM    [facility], [place], [place]  [address] Phone: [phone]; Fax: [phone]

## 2015-07-31 NOTE — Patient Instructions (Signed)

## 2015-08-18 ENCOUNTER — Other Ambulatory Visit: Payer: Self-pay | Admitting: Cardiovascular Disease

## 2015-10-26 DIAGNOSIS — H2513 Age-related nuclear cataract, bilateral: Secondary | ICD-10-CM | POA: Diagnosis not present

## 2015-10-26 DIAGNOSIS — Z0100 Encounter for examination of eyes and vision without abnormal findings: Secondary | ICD-10-CM | POA: Diagnosis not present

## 2016-01-06 ENCOUNTER — Other Ambulatory Visit: Payer: Self-pay | Admitting: Cardiovascular Disease

## 2016-01-11 ENCOUNTER — Encounter: Payer: Self-pay | Admitting: Cardiovascular Disease

## 2016-01-11 DIAGNOSIS — Z0000 Encounter for general adult medical examination without abnormal findings: Secondary | ICD-10-CM | POA: Diagnosis not present

## 2016-01-11 DIAGNOSIS — Z23 Encounter for immunization: Secondary | ICD-10-CM | POA: Diagnosis not present

## 2016-01-11 DIAGNOSIS — E785 Hyperlipidemia, unspecified: Secondary | ICD-10-CM | POA: Diagnosis not present

## 2016-01-11 DIAGNOSIS — I2510 Atherosclerotic heart disease of native coronary artery without angina pectoris: Secondary | ICD-10-CM | POA: Diagnosis not present

## 2016-01-11 DIAGNOSIS — R7303 Prediabetes: Secondary | ICD-10-CM | POA: Diagnosis not present

## 2016-01-11 DIAGNOSIS — I10 Essential (primary) hypertension: Secondary | ICD-10-CM | POA: Diagnosis not present

## 2016-01-11 DIAGNOSIS — L989 Disorder of the skin and subcutaneous tissue, unspecified: Secondary | ICD-10-CM | POA: Diagnosis not present

## 2016-03-13 NOTE — Progress Notes (Signed)
Chief Complaint  Patient presents with  . Back Pain    History of Present Illness: 82 yo WM with history of colon cancer, HLD, CAD s/p CABG who is here today for cardiac follow up. He initially presented in June 2012 with exertional dyspnea and chest pain. Stress myoview June 2012 showed a large area of reversible ischemia including the septum and apex. I arranged a cardiac cath which showed severe left main and LAD ostial stenosis. He underwent 2V CABG per Dr. Cyndia Bent on 01/04/11.  He has not tolerated beta blockers due to fatigue.   He is here today for follow up. He tells me that he is feeling well. No chest pain or SOB. No dizziness, near syncope or syncope. He has not been exercising. He reports constipation and back pain.   Primary Care Physician: Orpah Melter, MD   Past Medical History:  Diagnosis Date  . CAD (coronary artery disease)    2V CABG September 2012  . Colon polyps   . ED (erectile dysfunction)   . Hearing loss    wears hearing aids  . History of colon cancer    Followed by Dr Verl Blalock  . HTN (hypertension)   . Hyperlipidemia     Past Surgical History:  Procedure Laterality Date  . APPENDECTOMY    . CABG X 2  01/04/2011   Dr Cyndia Bent  . CARDIAC CATHETERIZATION    . PARTIAL COLECTOMY  1995   Dr March Rummage  . TONSILLECTOMY    . TYMPANOSTOMY TUBE PLACEMENT  4/09    Current Outpatient Prescriptions  Medication Sig Dispense Refill  . aspirin 81 MG tablet Take 81 mg by mouth daily.      Marland Kitchen b complex vitamins capsule Take 1 capsule by mouth daily.      Marland Kitchen losartan (COZAAR) 25 MG tablet Take 1 tablet (25 mg total) by mouth daily. 90 tablet 1  . Multiple Vitamin (MULTIVITAMIN) tablet Take 1 tablet by mouth daily.      . nitroGLYCERIN (NITROSTAT) 0.4 MG SL tablet Place 1 tablet (0.4 mg total) under the tongue every 5 (five) minutes as needed for chest pain. 25 tablet 6  . simvastatin (ZOCOR) 20 MG tablet Take 20 mg by mouth daily.     No current  facility-administered medications for this visit.     No Known Allergies  Social History   Social History  . Marital status: Married    Spouse name: N/A  . Number of children: 2  . Years of education: 48yrs   Occupational History  . 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 Smoking status: Former Smoker    Packs/day: 1.00    Years: 17.00    Types: Cigarettes    Quit date: 05/13/1976  . Smokeless tobacco: Never Used  . Alcohol use 4.2 oz/week    7 Glasses of wine per week  . Drug use: No  . Sexual activity: Not on file   Other Topics Concern  . Not on file   Social History Narrative  . No narrative on file    Family History  Problem Relation Age of Onset  . Heart failure Mother   . Dementia Mother   . Stroke Father 89  . Colon polyps Brother   . Colon polyps Sister   . Parkinson's disease Brother     Review of Systems:  As stated in the HPI and otherwise negative.   BP 140/78   Pulse Marland Kitchen)  59   Ht 5\' 10"  (1.778 m)   Wt 179 lb 3.2 oz (81.3 kg)   SpO2 97%   BMI 25.71 kg/m   Physical Examination: General: Well developed, well nourished, NAD  HEENT: OP clear, mucus membranes moist  SKIN: warm, dry. No rashes. Neuro: No focal deficits  Musculoskeletal: Muscle strength 5/5 all ext  Psychiatric: Mood and affect normal  Neck: No JVD, no carotid bruits, no thyromegaly, no lymphadenopathy.  Lungs:Clear bilaterally, no wheezes, rhonci, crackles Cardiovascular: Regular rate and rhythm. Soft systolic murmur. No gallops or rubs. Abdomen:Soft. Bowel sounds present. Non-tender.  Extremities: No lower extremity edema. Pulses are 2 + in the bilateral DP/PT.  EKG:  EKG is ordered today. The ekg ordered today demonstrates sinus bradycardia, rate 59 bpm.   Recent Labs: No results found for requested labs within last 8760 hours.   Lipid Panel Followed in primary care   Wt Readings from Last 3 Encounters:  03/14/16 179 lb 3.2 oz (81.3 kg)  07/31/15 177  lb 12.8 oz (80.6 kg)  01/09/15 176 lb 3.2 oz (79.9 kg)     Other studies Reviewed: Additional studies/ records that were reviewed today include: . Review of the above records demonstrates:    Assessment and Plan:   1. CAD: No chest pain suggestive of angina. No beta blocker due to bradycardia. Will continue ASA and Zocor.   2. HLD: Lipids followed in primary care.  Continue statin.    3. HTN: BP controlled on current dose of Cozaar. No changes.   4. Cardiac murmur: Will arrange echo to assess.   Current medicines are reviewed at length with the patient today.  The patient does not have concerns regarding medicines.  The following changes have been made:  Stop amiodarone  Labs/ tests ordered today include:   Orders Placed This Encounter  Procedures  . EKG 12-Lead  . ECHOCARDIOGRAM COMPLETE    Disposition:   FU with me in 6 months  Signed, Lauree Chandler, MD 03/14/2016 8:43 AM    [facility] [place], [place], [place]  [address] Phone: [phone]; Fax: [phone]

## 2016-03-14 ENCOUNTER — Encounter: Payer: Self-pay | Admitting: Cardiovascular Disease

## 2016-03-14 ENCOUNTER — Ambulatory Visit (INDEPENDENT_AMBULATORY_CARE_PROVIDER_SITE_OTHER): Payer: Medicare Other | Admitting: Cardiovascular Disease

## 2016-03-14 VITALS — BP 140/78 | HR 59 | Ht 70.0 in | Wt 179.2 lb

## 2016-03-14 DIAGNOSIS — R011 Cardiac murmur, unspecified: Secondary | ICD-10-CM | POA: Diagnosis not present

## 2016-03-14 DIAGNOSIS — I10 Essential (primary) hypertension: Secondary | ICD-10-CM

## 2016-03-14 DIAGNOSIS — E7800 Pure hypercholesterolemia, unspecified: Secondary | ICD-10-CM | POA: Diagnosis not present

## 2016-03-14 DIAGNOSIS — I25810 Atherosclerosis of coronary artery bypass graft(s) without angina pectoris: Secondary | ICD-10-CM

## 2016-03-14 NOTE — Patient Instructions (Signed)

## 2016-04-15 ENCOUNTER — Other Ambulatory Visit: Payer: Self-pay

## 2016-04-15 ENCOUNTER — Ambulatory Visit (HOSPITAL_COMMUNITY): Payer: Medicare Other | Attending: Cardiology

## 2016-04-15 DIAGNOSIS — E785 Hyperlipidemia, unspecified: Secondary | ICD-10-CM | POA: Insufficient documentation

## 2016-04-15 DIAGNOSIS — I10 Essential (primary) hypertension: Secondary | ICD-10-CM | POA: Insufficient documentation

## 2016-04-15 DIAGNOSIS — R011 Cardiac murmur, unspecified: Secondary | ICD-10-CM | POA: Insufficient documentation

## 2016-04-15 DIAGNOSIS — Z951 Presence of aortocoronary bypass graft: Secondary | ICD-10-CM | POA: Diagnosis not present

## 2016-04-15 DIAGNOSIS — I2510 Atherosclerotic heart disease of native coronary artery without angina pectoris: Secondary | ICD-10-CM | POA: Insufficient documentation

## 2016-04-15 LAB — ECHOCARDIOGRAM COMPLETE
CHL CUP MV DEC (S): 282
CHL CUP TV REG PEAK VELOCITY: 276 cm/s
E decel time: 282 msec
EERAT: 7.59
FS: 34 % (ref 28–44)
IVS/LV PW RATIO, ED: 1.01
LA ID, A-P, ES: 44 mm
LA diam index: 2.21 cm/m2
LA vol A4C: 48.4 ml
LA vol index: 31 mL/m2
LAVOL: 61.7 mL
LDCA: 3.46 cm2
LEFT ATRIUM END SYS DIAM: 44 mm
LV E/e'average: 7.59
LV PW d: 9.68 mm — AB (ref 0.6–1.1)
LV TDI E'MEDIAL: 9.03
LV e' LATERAL: 12.4 cm/s
LVEEMED: 7.59
LVOT SV: 70 mL
LVOT VTI: 20.3 cm
LVOTD: 21 mm
LVOTPV: 88.7 cm/s
MV pk A vel: 70.8 m/s
MV pk E vel: 94.1 m/s
MVPG: 4 mmHg
RV TAPSE: 19.5 mm
TDI e' lateral: 12.4
TR max vel: 276 cm/s

## 2016-04-30 DIAGNOSIS — D485 Neoplasm of uncertain behavior of skin: Secondary | ICD-10-CM | POA: Diagnosis not present

## 2016-04-30 DIAGNOSIS — C4402 Squamous cell carcinoma of skin of lip: Secondary | ICD-10-CM | POA: Diagnosis not present

## 2016-04-30 DIAGNOSIS — C44319 Basal cell carcinoma of skin of other parts of face: Secondary | ICD-10-CM | POA: Diagnosis not present

## 2016-05-26 ENCOUNTER — Encounter: Payer: Self-pay | Admitting: Cardiovascular Disease

## 2016-06-11 ENCOUNTER — Encounter: Payer: Self-pay | Admitting: Cardiovascular Disease

## 2016-07-06 ENCOUNTER — Other Ambulatory Visit: Payer: Self-pay | Admitting: Cardiovascular Disease

## 2016-07-17 DIAGNOSIS — C44319 Basal cell carcinoma of skin of other parts of face: Secondary | ICD-10-CM | POA: Diagnosis not present

## 2016-08-12 ENCOUNTER — Other Ambulatory Visit: Payer: Self-pay | Admitting: Cardiovascular Disease

## 2016-08-13 DIAGNOSIS — H90A31 Mixed conductive and sensorineural hearing loss, unilateral, right ear with restricted hearing on the contralateral side: Secondary | ICD-10-CM | POA: Diagnosis not present

## 2016-08-13 DIAGNOSIS — H9313 Tinnitus, bilateral: Secondary | ICD-10-CM | POA: Diagnosis not present

## 2016-08-13 DIAGNOSIS — H90A22 Sensorineural hearing loss, unilateral, left ear, with restricted hearing on the contralateral side: Secondary | ICD-10-CM | POA: Diagnosis not present

## 2016-08-14 NOTE — Telephone Encounter (Signed)
OK to refill.  Lipid panel done in primary care on 01/11/16 is scanned into chart.

## 2016-08-21 DIAGNOSIS — D040 Carcinoma in situ of skin of lip: Secondary | ICD-10-CM | POA: Diagnosis not present

## 2016-08-29 ENCOUNTER — Encounter: Payer: Self-pay | Admitting: Cardiovascular Disease

## 2016-09-10 DIAGNOSIS — J321 Chronic frontal sinusitis: Secondary | ICD-10-CM | POA: Diagnosis not present

## 2016-09-10 DIAGNOSIS — H9011 Conductive hearing loss, unilateral, right ear, with unrestricted hearing on the contralateral side: Secondary | ICD-10-CM | POA: Diagnosis not present

## 2016-09-10 DIAGNOSIS — J342 Deviated nasal septum: Secondary | ICD-10-CM | POA: Diagnosis not present

## 2016-09-10 DIAGNOSIS — J343 Hypertrophy of nasal turbinates: Secondary | ICD-10-CM | POA: Diagnosis not present

## 2016-09-19 ENCOUNTER — Ambulatory Visit (INDEPENDENT_AMBULATORY_CARE_PROVIDER_SITE_OTHER): Payer: Medicare Other | Admitting: Cardiovascular Disease

## 2016-09-19 ENCOUNTER — Encounter: Payer: Self-pay | Admitting: Cardiovascular Disease

## 2016-09-19 VITALS — BP 130/70 | HR 61 | Ht 70.0 in | Wt 176.1 lb

## 2016-09-19 DIAGNOSIS — E7800 Pure hypercholesterolemia, unspecified: Secondary | ICD-10-CM | POA: Diagnosis not present

## 2016-09-19 DIAGNOSIS — I25810 Atherosclerosis of coronary artery bypass graft(s) without angina pectoris: Secondary | ICD-10-CM

## 2016-09-19 DIAGNOSIS — I10 Essential (primary) hypertension: Secondary | ICD-10-CM | POA: Diagnosis not present

## 2016-09-19 DIAGNOSIS — I340 Nonrheumatic mitral (valve) insufficiency: Secondary | ICD-10-CM | POA: Diagnosis not present

## 2016-09-19 NOTE — Progress Notes (Signed)
Chief Complaint  Patient presents with  . Follow-up    CAD    History of Present Illness: 83 yo male with history of CAD s/p CABG, HLD, colon cancer here for follow up. He was found to have severe CAD in 2012 and underwent 2V CABG in August 2012. He has done well since then. He has not tolerated beta blockers due to fatigue.  Echo December 2017 with normal LV systolic function, mild MR. He had a skin cancer removed from his forehead in April 2018.   He is here today for follow up. The patient denies any chest pain, dyspnea, palpitations, lower extremity edema, orthopnea, PND, dizziness, near syncope or syncope.   Primary Care Physician: Orpah Melter, MD   Past Medical History:  Diagnosis Date  . CAD (coronary artery disease)    2V CABG September 2012  . Colon polyps   . ED (erectile dysfunction)   . Hearing loss    wears hearing aids  . History of colon cancer    Followed by Dr Verl Blalock  . HTN (hypertension)   . Hyperlipidemia     Past Surgical History:  Procedure Laterality Date  . APPENDECTOMY    . CABG X 2  01/04/2011   Dr Cyndia Bent  . CARDIAC CATHETERIZATION    . PARTIAL COLECTOMY  1995   Dr March Rummage  . TONSILLECTOMY    . TYMPANOSTOMY TUBE PLACEMENT  4/09    Current Outpatient Prescriptions  Medication Sig Dispense Refill  . aspirin 81 MG tablet Take 81 mg by mouth daily.      Marland Kitchen losartan (COZAAR) 25 MG tablet Take 1 tablet (25 mg total) by mouth daily. 90 tablet 2  . Multiple Vitamin (MULTIVITAMIN) tablet Take 1 tablet by mouth daily.      . nitroGLYCERIN (NITROSTAT) 0.4 MG SL tablet Place 1 tablet (0.4 mg total) under the tongue every 5 (five) minutes as needed for chest pain. 25 tablet 6  . simvastatin (ZOCOR) 20 MG tablet TAKE 1 TABLET AT BEDTIME 90 tablet 3   No current facility-administered medications for this visit.     No Known Allergies  Social History   Social History  . Marital status: Married    Spouse name: N/A  . Number of children: 2    . Years of education: 59yrs   Occupational History  . 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 Smoking status: Former Smoker    Packs/day: 1.00    Years: 17.00    Types: Cigarettes    Quit date: 05/13/1976  . Smokeless tobacco: Never Used  . Alcohol use 4.2 oz/week    7 Glasses of wine per week  . Drug use: No  . Sexual activity: Not on file   Other Topics Concern  . Not on file   Social History Narrative  . No narrative on file    Family History  Problem Relation Age of Onset  . Heart failure Mother   . Dementia Mother   . Stroke Father 75  . Colon polyps Brother   . Colon polyps Sister   . Parkinson's disease Brother     Review of Systems:  As stated in the HPI and otherwise negative.   BP 130/70   Pulse 61   Ht 5\' 10"  (1.778 m)   Wt 176 lb 1.9 oz (79.9 kg)   SpO2 96%   BMI 25.27 kg/m   Physical Examination:  General: Well developed, well nourished,  NAD  HEENT: OP clear, mucus membranes moist  SKIN: warm, dry. No rashes. Neuro: No focal deficits  Musculoskeletal: Muscle strength 5/5 all ext  Psychiatric: Mood and affect normal  Neck: No JVD, no carotid bruits, no thyromegaly, no lymphadenopathy.  Lungs:Clear bilaterally, no wheezes, rhonci, crackles Cardiovascular: Regular rate and rhythm. No murmurs, gallops or rubs. Abdomen:Soft. Bowel sounds present. Non-tender.  Extremities: No lower extremity edema. Pulses are 2 + in the bilateral DP/PT.  Echo December 2017: Left ventricle: The cavity size was normal. Wall thickness was   normal. Systolic function was normal. The estimated ejection   fraction was in the range of 60% to 65%. Wall motion was normal;   there were no regional wall motion abnormalities. Doppler   parameters are consistent with abnormal left ventricular   relaxation (grade 1 diastolic dysfunction). - Mitral valve: Calcified annulus. There was mild regurgitation. - Left atrium: The atrium was mildly dilated. - Tricuspid  valve: There was moderate regurgitation.  EKG:  EKG is ordered today. The ekg ordered today demonstrates NSR, rate 61 bpm.   Recent Labs: No results found for requested labs within last 8760 hours.   Lipid Panel Followed in primary care   Wt Readings from Last 3 Encounters:  09/19/16 176 lb 1.9 oz (79.9 kg)  03/14/16 179 lb 3.2 oz (81.3 kg)  07/31/15 177 lb 12.8 oz (80.6 kg)     Other studies Reviewed: Additional studies/ records that were reviewed today include: . Review of the above records demonstrates:    Assessment and Plan:   1. CAD without angina: No chest pain suggestive of unstable angina. He is very active. Will continue medical therapy with ASA and Zocor. He does not tolerate beta blockers due to bradycardia.    2. HLD: He is on a statin. Lipids well controlled in primary care.     3. HTN: BP is controlled. No changes.    4. Mitral regurgitation: Mild MR by echo December 2017.   Current medicines are reviewed at length with the patient today.  The patient does not have concerns regarding medicines.  The following changes have been made:  Stop amiodarone  Labs/ tests ordered today include:   No orders of the defined types were placed in this encounter.   Disposition:   FU with me in 6 months  Signed, Lauree Chandler, MD 09/19/2016 8:36 AM    [facility] [place], [place],   [address] Phone: [phone]; Fax: [phone]

## 2016-09-19 NOTE — Patient Instructions (Signed)

## 2016-10-16 DIAGNOSIS — H838X3 Other specified diseases of inner ear, bilateral: Secondary | ICD-10-CM | POA: Diagnosis not present

## 2016-10-16 DIAGNOSIS — H6983 Other specified disorders of Eustachian tube, bilateral: Secondary | ICD-10-CM | POA: Diagnosis not present

## 2016-10-30 DIAGNOSIS — H52203 Unspecified astigmatism, bilateral: Secondary | ICD-10-CM | POA: Diagnosis not present

## 2016-10-30 DIAGNOSIS — H2513 Age-related nuclear cataract, bilateral: Secondary | ICD-10-CM | POA: Diagnosis not present

## 2017-01-23 DIAGNOSIS — Z23 Encounter for immunization: Secondary | ICD-10-CM | POA: Diagnosis not present

## 2017-01-23 DIAGNOSIS — R7303 Prediabetes: Secondary | ICD-10-CM | POA: Diagnosis not present

## 2017-01-23 DIAGNOSIS — I10 Essential (primary) hypertension: Secondary | ICD-10-CM | POA: Diagnosis not present

## 2017-01-23 DIAGNOSIS — I2510 Atherosclerotic heart disease of native coronary artery without angina pectoris: Secondary | ICD-10-CM | POA: Diagnosis not present

## 2017-01-23 DIAGNOSIS — E785 Hyperlipidemia, unspecified: Secondary | ICD-10-CM | POA: Diagnosis not present

## 2017-01-23 DIAGNOSIS — Z0000 Encounter for general adult medical examination without abnormal findings: Secondary | ICD-10-CM | POA: Diagnosis not present

## 2017-02-05 DIAGNOSIS — H903 Sensorineural hearing loss, bilateral: Secondary | ICD-10-CM | POA: Diagnosis not present

## 2017-02-05 DIAGNOSIS — H6981 Other specified disorders of Eustachian tube, right ear: Secondary | ICD-10-CM | POA: Diagnosis not present

## 2017-03-27 NOTE — Progress Notes (Signed)
Chief Complaint  Patient presents with  . Coronary Artery Disease    History of Present Illness: 83 yo male with history of CAD s/p 2V CABG, HLD, colon cancer here for follow up. He was found to have severe CAD in 2012 and underwent 2V CABG in August 2012. He has not tolerated beta blockers due to fatigue.  Echo December 2017 with normal LV systolic function, mild MR.   He is here today for follow up. The patient denies any chest pain, dyspnea, palpitations, lower extremity edema, orthopnea, PND, dizziness, near syncope or syncope. He is very active.   Primary Care Physician: Orpah Melter, MD  Past Medical History:  Diagnosis Date  . CAD (coronary artery disease)    2V CABG September 2012  . Colon polyps   . ED (erectile dysfunction)   . Hearing loss    wears hearing aids  . History of colon cancer    Followed by Dr Verl Blalock  . HTN (hypertension)   . Hyperlipidemia     Past Surgical History:  Procedure Laterality Date  . APPENDECTOMY    . CABG X 2  01/04/2011   Dr Cyndia Bent  . CARDIAC CATHETERIZATION    . PARTIAL COLECTOMY  1995   Dr March Rummage  . TONSILLECTOMY    . TYMPANOSTOMY TUBE PLACEMENT  4/09    Current Outpatient Medications  Medication Sig Dispense Refill  . aspirin 81 MG tablet Take 81 mg by mouth daily.      Marland Kitchen losartan (COZAAR) 25 MG tablet Take 1 tablet (25 mg total) by mouth daily. 90 tablet 2  . Multiple Vitamin (MULTIVITAMIN) tablet Take 1 tablet by mouth daily.      . nitroGLYCERIN (NITROSTAT) 0.4 MG SL tablet Place 1 tablet (0.4 mg total) every 5 (five) minutes as needed under the tongue for chest pain. 75 tablet 3  . simvastatin (ZOCOR) 20 MG tablet TAKE 1 TABLET AT BEDTIME 90 tablet 3   No current facility-administered medications for this visit.     No Known Allergies  Social History   Socioeconomic History  . Marital status: Married    Spouse name: Not on file  . Number of children: 2  . Years of education: 24yrs  . Highest education  level: Not on file  Social Needs  . Financial resource strain: Not on file  . Food insecurity - worry: Not on file  . Food insecurity - inability: Not on file  . Transportation needs - medical: Not on file  . Transportation needs - non-medical: Not on file  Occupational History  . 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 Smoking status: Former Smoker    Packs/day: 1.00    Years: 17.00    Pack years: 17.00    Types: Cigarettes    Last attempt to quit: 05/13/1976    Years since quitting: 40.9  . Smokeless tobacco: Never Used  Substance and Sexual Activity  . Alcohol use: Yes    Alcohol/week: 4.2 oz    Types: 7 Glasses of wine per week  . Drug use: No  . Sexual activity: Not on file  Other Topics Concern  . Not on file  Social History Narrative  . Not on file    Family History  Problem Relation Age of Onset  . Heart failure Mother   . Dementia Mother   . Stroke Father 52  . Colon polyps Brother   . Colon polyps Sister   . Parkinson's disease Brother  Review of Systems:  As stated in the HPI and otherwise negative.   BP 126/70   Pulse 67   Ht 5\' 9"  (1.753 m)   Wt 175 lb (79.4 kg)   SpO2 97%   BMI 25.84 kg/m   Physical Examination:  General: Well developed, well nourished, NAD  HEENT: OP clear, mucus membranes moist  SKIN: warm, dry. No rashes. Neuro: No focal deficits  Musculoskeletal: Muscle strength 5/5 all ext  Psychiatric: Mood and affect normal  Neck: No JVD, no carotid bruits, no thyromegaly, no lymphadenopathy.  Lungs:Clear bilaterally, no wheezes, rhonci, crackles Cardiovascular: Regular rate and rhythm. No murmurs, gallops or rubs. Abdomen:Soft. Bowel sounds present. Non-tender.  Extremities: No lower extremity edema. Pulses are 2 + in the bilateral DP/PT.  Echo December 2017: Left ventricle: The cavity size was normal. Wall thickness was   normal. Systolic function was normal. The estimated ejection   fraction was in the range of 60%  to 65%. Wall motion was normal;   there were no regional wall motion abnormalities. Doppler   parameters are consistent with abnormal left ventricular   relaxation (grade 1 diastolic dysfunction). - Mitral valve: Calcified annulus. There was mild regurgitation. - Left atrium: The atrium was mildly dilated. - Tricuspid valve: There was moderate regurgitation.  EKG:  EKG is not ordered today. The ekg ordered today demonstrates   Recent Labs: No results found for requested labs within last 8760 hours.   Lipid Panel Followed in primary care   Wt Readings from Last 3 Encounters:  03/28/17 175 lb (79.4 kg)  09/19/16 176 lb 1.9 oz (79.9 kg)  03/14/16 179 lb 3.2 oz (81.3 kg)     Other studies Reviewed: Additional studies/ records that were reviewed today include: . Review of the above records demonstrates:    Assessment and Plan:   1. CAD without angina: He is doing well with no chest pain. Continue ASA and Zocor. He does not tolerate beta blockers due to bradycardia.     2. HLD: Lipids well controlled in primary care. Continue statin.    3. HTN: BP is well controlled. No changes.   4. Mitral regurgitation: Echo December 2017 with mild MR. Repeat echo in December 2020.   Current medicines are reviewed at length with the patient today.  The patient does not have concerns regarding medicines.  The following changes have been made:  Stop amiodarone  Labs/ tests ordered today include:   No orders of the defined types were placed in this encounter.   Disposition:   FU with me in 6 months  Signed, Lauree Chandler, MD 03/28/2017 8:31 AM    [facility] [place], [place], [place]  [address] Phone: [phone]; Fax: [phone]

## 2017-03-28 ENCOUNTER — Encounter: Payer: Self-pay | Admitting: Cardiovascular Disease

## 2017-03-28 ENCOUNTER — Ambulatory Visit (INDEPENDENT_AMBULATORY_CARE_PROVIDER_SITE_OTHER): Payer: Medicare Other | Admitting: Cardiovascular Disease

## 2017-03-28 VITALS — BP 126/70 | HR 67 | Ht 69.0 in | Wt 175.0 lb

## 2017-03-28 DIAGNOSIS — I10 Essential (primary) hypertension: Secondary | ICD-10-CM | POA: Diagnosis not present

## 2017-03-28 DIAGNOSIS — E7800 Pure hypercholesterolemia, unspecified: Secondary | ICD-10-CM | POA: Diagnosis not present

## 2017-03-28 DIAGNOSIS — I340 Nonrheumatic mitral (valve) insufficiency: Secondary | ICD-10-CM | POA: Diagnosis not present

## 2017-03-28 DIAGNOSIS — I25810 Atherosclerosis of coronary artery bypass graft(s) without angina pectoris: Secondary | ICD-10-CM | POA: Diagnosis not present

## 2017-03-28 MED ORDER — NITROGLYCERIN 0.4 MG SL SUBL
0.4000 mg | SUBLINGUAL_TABLET | SUBLINGUAL | 3 refills | Status: DC | PRN
Start: 2017-03-28 — End: 2018-04-30

## 2017-03-28 NOTE — Patient Instructions (Signed)

## 2017-04-04 ENCOUNTER — Other Ambulatory Visit: Payer: Self-pay | Admitting: Cardiovascular Disease

## 2017-04-08 ENCOUNTER — Encounter: Payer: Self-pay | Admitting: Cardiovascular Disease

## 2017-04-09 NOTE — Telephone Encounter (Signed)
I spoke with pt  

## 2017-08-09 ENCOUNTER — Other Ambulatory Visit: Payer: Self-pay | Admitting: Cardiovascular Disease

## 2017-09-05 ENCOUNTER — Encounter: Payer: Self-pay | Admitting: Cardiovascular Disease

## 2017-09-25 ENCOUNTER — Ambulatory Visit: Payer: Medicare Other | Admitting: Cardiovascular Disease

## 2017-11-03 DIAGNOSIS — H2513 Age-related nuclear cataract, bilateral: Secondary | ICD-10-CM | POA: Diagnosis not present

## 2017-11-03 DIAGNOSIS — H5203 Hypermetropia, bilateral: Secondary | ICD-10-CM | POA: Diagnosis not present

## 2017-11-03 DIAGNOSIS — H52203 Unspecified astigmatism, bilateral: Secondary | ICD-10-CM | POA: Diagnosis not present

## 2017-12-12 ENCOUNTER — Encounter: Payer: Self-pay | Admitting: Cardiovascular Disease

## 2017-12-12 ENCOUNTER — Ambulatory Visit (INDEPENDENT_AMBULATORY_CARE_PROVIDER_SITE_OTHER): Payer: Medicare Other | Admitting: Cardiovascular Disease

## 2017-12-12 VITALS — BP 142/80 | HR 69 | Ht 69.0 in | Wt 172.8 lb

## 2017-12-12 DIAGNOSIS — I25810 Atherosclerosis of coronary artery bypass graft(s) without angina pectoris: Secondary | ICD-10-CM

## 2017-12-12 DIAGNOSIS — I10 Essential (primary) hypertension: Secondary | ICD-10-CM

## 2017-12-12 DIAGNOSIS — E7800 Pure hypercholesterolemia, unspecified: Secondary | ICD-10-CM | POA: Diagnosis not present

## 2017-12-12 DIAGNOSIS — I340 Nonrheumatic mitral (valve) insufficiency: Secondary | ICD-10-CM | POA: Diagnosis not present

## 2017-12-12 NOTE — Patient Instructions (Signed)

## 2017-12-12 NOTE — Progress Notes (Signed)
Chief Complaint  Patient presents with  . Follow-up    CAD    History of Present Illness: 84 yo male with history of CAD s/p 2V CABG in 2012,hyperlipidemia and colon cancer here for follow up. He was found to have severe CAD in 2012 and underwent 2V CABG in August 2012. He has not tolerated beta blockers due to fatigue.  Echo December 2017 with normal LV systolic function, mild MR.   He is here today for follow up. The patient denies any chest pain, dyspnea, palpitations, lower extremity edema, orthopnea, PND, dizziness, near syncope or syncope.   Primary Care Physician: Orpah Melter, MD  Past Medical History:  Diagnosis Date  . CAD (coronary artery disease)    2V CABG September 2012  . Colon polyps   . ED (erectile dysfunction)   . Hearing loss    wears hearing aids  . History of colon cancer    Followed by Dr Verl Blalock  . HTN (hypertension)   . Hyperlipidemia     Past Surgical History:  Procedure Laterality Date  . APPENDECTOMY    . CABG X 2  01/04/2011   Dr Cyndia Bent  . CARDIAC CATHETERIZATION    . PARTIAL COLECTOMY  1995   Dr March Rummage  . TONSILLECTOMY    . TYMPANOSTOMY TUBE PLACEMENT  4/09    Current Outpatient Medications  Medication Sig Dispense Refill  . aspirin 81 MG tablet Take 81 mg by mouth daily.      Marland Kitchen losartan (COZAAR) 25 MG tablet TAKE 1 TABLET DAILY 90 tablet 3  . Multiple Vitamin (MULTIVITAMIN) tablet Take 1 tablet by mouth daily.      . nitroGLYCERIN (NITROSTAT) 0.4 MG SL tablet Place 1 tablet (0.4 mg total) every 5 (five) minutes as needed under the tongue for chest pain. 75 tablet 3  . simvastatin (ZOCOR) 20 MG tablet TAKE 1 TABLET AT BEDTIME 90 tablet 2   No current facility-administered medications for this visit.     No Known Allergies  Social History   Socioeconomic History  . Marital status: Married    Spouse name: Not on file  . Number of children: 2  . Years of education: 47yrs  . Highest education level: Not on file    Occupational History  . 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 Financial resource strain: Not on file  . Food insecurity:    Worry: Not on file    Inability: Not on file  . Transportation needs:    Medical: Not on file    Non-medical: Not on file  Tobacco Use  . Smoking status: Former Smoker    Packs/day: 1.00    Years: 17.00    Pack years: 17.00    Types: Cigarettes    Last attempt to quit: 05/13/1976    Years since quitting: 41.6  . Smokeless tobacco: Never Used  Substance and Sexual Activity  . Alcohol use: Yes    Alcohol/week: 4.2 oz    Types: 7 Glasses of wine per week  . Drug use: No  . Sexual activity: Not on file  Lifestyle  . Physical activity:    Days per week: Not on file    Minutes per session: Not on file  . Stress: Not on file  Relationships  . Social connections:    Talks on phone: Not on file    Gets together: Not on file    Attends religious service: Not on file    Active  member of club or organization: Not on file    Attends meetings of clubs or organizations: Not on file    Relationship status: Not on file  . Intimate partner violence:    Fear of current or ex partner: Not on file    Emotionally abused: Not on file    Physically abused: Not on file    Forced sexual activity: Not on file  Other Topics Concern  . Not on file  Social History Narrative  . Not on file    Family History  Problem Relation Age of Onset  . Heart failure Mother   . Dementia Mother   . Stroke Father 53  . Colon polyps Brother   . Colon polyps Sister   . Parkinson's disease Brother     Review of Systems:  As stated in the HPI and otherwise negative.   BP (!) 142/80   Pulse 69   Ht 5\' 9"  (1.753 m)   Wt 172 lb 12.8 oz (78.4 kg)   SpO2 96%   BMI 25.52 kg/m   Physical Examination:  General: Well developed, well nourished, NAD  HEENT: OP clear, mucus membranes moist  SKIN: warm, dry. No rashes. Neuro: No focal deficits  Musculoskeletal: Muscle  strength 5/5 all ext  Psychiatric: Mood and affect normal  Neck: No JVD, no carotid bruits, no thyromegaly, no lymphadenopathy.  Lungs:Clear bilaterally, no wheezes, rhonci, crackles Cardiovascular: Regular rate and rhythm. No murmurs, gallops or rubs. Abdomen:Soft. Bowel sounds present. Non-tender.  Extremities: No lower extremity edema. Pulses are 2 + in the bilateral DP/PT.  Echo December 2017: Left ventricle: The cavity size was normal. Wall thickness was   normal. Systolic function was normal. The estimated ejection   fraction was in the range of 60% to 65%. Wall motion was normal;   there were no regional wall motion abnormalities. Doppler   parameters are consistent with abnormal left ventricular   relaxation (grade 1 diastolic dysfunction). - Mitral valve: Calcified annulus. There was mild regurgitation. - Left atrium: The atrium was mildly dilated. - Tricuspid valve: There was moderate regurgitation.  EKG:  EKG is ordered today. The ekg ordered today demonstrates Sinus, rate 62 bpm. PVC.   Recent Labs: No results found for requested labs within last 8760 hours.   Lipid Panel Followed in primary care   Wt Readings from Last 3 Encounters:  12/12/17 172 lb 12.8 oz (78.4 kg)  03/28/17 175 lb (79.4 kg)  09/19/16 176 lb 1.9 oz (79.9 kg)     Other studies Reviewed: Additional studies/ records that were reviewed today include: . Review of the above records demonstrates:    Assessment and Plan:   1. CAD s/p CABG without angina: No chest pain. Will continue ASA and statin. He does not tolerate beta blockers due to bradycardia.      2. HLD: Lipids controlled in primary care. Hs LDL is 87 but his HDL is high at 72. Continue statin.   3. HTN: BP is controlled at home. No changes in therapy  4. Mitral regurgitation: Echo December 2017 with mild MR. Will repeat echo in December 2020.    Current medicines are reviewed at length with the patient today.  The patient does not  have concerns regarding medicines.  The following changes have been made:   Labs/ tests ordered today include:   Orders Placed This Encounter  Procedures  . EKG 12-Lead    Disposition:   FU with me in 6 months  Signed, Harrell Gave  Angelena Form, MD 12/12/2017 11:26 AM    [facility] [place], [place], [place]  [address] Phone: [phone]; Fax: [phone]

## 2018-01-15 ENCOUNTER — Other Ambulatory Visit: Payer: Self-pay | Admitting: Cardiovascular Disease

## 2018-01-27 DIAGNOSIS — Z0000 Encounter for general adult medical examination without abnormal findings: Secondary | ICD-10-CM | POA: Diagnosis not present

## 2018-01-27 DIAGNOSIS — I2510 Atherosclerotic heart disease of native coronary artery without angina pectoris: Secondary | ICD-10-CM | POA: Diagnosis not present

## 2018-01-27 DIAGNOSIS — R7303 Prediabetes: Secondary | ICD-10-CM | POA: Diagnosis not present

## 2018-01-27 DIAGNOSIS — I10 Essential (primary) hypertension: Secondary | ICD-10-CM | POA: Diagnosis not present

## 2018-01-27 DIAGNOSIS — E785 Hyperlipidemia, unspecified: Secondary | ICD-10-CM | POA: Diagnosis not present

## 2018-02-03 DIAGNOSIS — Z23 Encounter for immunization: Secondary | ICD-10-CM | POA: Diagnosis not present

## 2018-04-30 ENCOUNTER — Other Ambulatory Visit: Payer: Self-pay | Admitting: Cardiovascular Disease

## 2018-06-14 NOTE — Progress Notes (Signed)
Chief Complaint  Patient presents with  . Follow-up    CAD    History of Present Illness: 85 yo male with history of CAD s/p 2V CABG in 2012,hyperlipidemia and colon cancer here for follow up. He was found to have severe CAD in 2012 and underwent 2V CABG in August 2012. He has not tolerated beta blockers due to fatigue.  Echo December 2017 with normal LV systolic function, mild MR.   He is here today for follow up. The patient denies any chest pain, dyspnea, palpitations, lower extremity edema, orthopnea, PND, dizziness, near syncope or syncope. He is worried about Ms. Ledbetter who is having memory issues.   Primary Care Physician: Orpah Melter, MD  Past Medical History:  Diagnosis Date  . CAD (coronary artery disease)    2V CABG September 2012  . Colon polyps   . ED (erectile dysfunction)   . Hearing loss    wears hearing aids  . History of colon cancer    Followed by Dr Verl Blalock  . HTN (hypertension)   . Hyperlipidemia     Past Surgical History:  Procedure Laterality Date  . APPENDECTOMY    . CABG X 2  01/04/2011   Dr Cyndia Bent  . CARDIAC CATHETERIZATION    . PARTIAL COLECTOMY  1995   Dr March Rummage  . TONSILLECTOMY    . TYMPANOSTOMY TUBE PLACEMENT  4/09    Current Outpatient Medications  Medication Sig Dispense Refill  . aspirin 81 MG tablet Take 81 mg by mouth daily.      . fluticasone (FLONASE) 50 MCG/ACT nasal spray Place 2 sprays into both nostrils as needed.    Marland Kitchen losartan (COZAAR) 25 MG tablet TAKE 1 TABLET DAILY 90 tablet 3  . Multiple Vitamin (MULTIVITAMIN) tablet Take 1 tablet by mouth daily.      . nitroGLYCERIN (NITROSTAT) 0.4 MG SL tablet DISSOLVE 1 TABLET UNDER THE TONGUE EVERY 5 MINUTES AS NEEDED FOR CHEST PAIN 25 tablet 4  . simvastatin (ZOCOR) 20 MG tablet TAKE 1 TABLET AT BEDTIME 90 tablet 2   No current facility-administered medications for this visit.     No Known Allergies  Social History   Socioeconomic History  . Marital status: Married     Spouse name: Not on file  . Number of children: 2  . Years of education: 39yrs  . Highest education level: Not on file  Occupational History  . 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 Financial resource strain: Not on file  . Food insecurity:    Worry: Not on file    Inability: Not on file  . Transportation needs:    Medical: Not on file    Non-medical: Not on file  Tobacco Use  . Smoking status: Former Smoker    Packs/day: 1.00    Years: 17.00    Pack years: 17.00    Types: Cigarettes    Last attempt to quit: 05/13/1976    Years since quitting: 42.1  . Smokeless tobacco: Never Used  Substance and Sexual Activity  . Alcohol use: Yes    Alcohol/week: 7.0 standard drinks    Types: 7 Glasses of wine per week  . Drug use: No  . Sexual activity: Not on file  Lifestyle  . Physical activity:    Days per week: Not on file    Minutes per session: Not on file  . Stress: Not on file  Relationships  . Social connections:    Talks on  phone: Not on file    Gets together: Not on file    Attends religious service: Not on file    Active member of club or organization: Not on file    Attends meetings of clubs or organizations: Not on file    Relationship status: Not on file  . Intimate partner violence:    Fear of current or ex partner: Not on file    Emotionally abused: Not on file    Physically abused: Not on file    Forced sexual activity: Not on file  Other Topics Concern  . Not on file  Social History Narrative  . Not on file    Family History  Problem Relation Age of Onset  . Heart failure Mother   . Dementia Mother   . Stroke Father 27  . Colon polyps Brother   . Colon polyps Sister   . Parkinson's disease Brother     Review of Systems:  As stated in the HPI and otherwise negative.   BP 122/72   Pulse 65   Ht 5\' 9"  (1.753 m)   Wt 167 lb 12.8 oz (76.1 kg)   SpO2 97%   BMI 24.78 kg/m   Physical Examination: General: Well developed, well  nourished, NAD  HEENT: OP clear, mucus membranes moist  SKIN: warm, dry. No rashes. Neuro: No focal deficits  Musculoskeletal: Muscle strength 5/5 all ext  Psychiatric: Mood and affect normal  Neck: No JVD, no carotid bruits, no thyromegaly, no lymphadenopathy.  Lungs:Clear bilaterally, no wheezes, rhonci, crackles Cardiovascular: Regular rate and rhythm. No murmurs, gallops or rubs. Abdomen:Soft. Bowel sounds present. Non-tender.  Extremities: No lower extremity edema. Pulses are 2 + in the bilateral DP/PT.  Echo December 2017: Left ventricle: The cavity size was normal. Wall thickness was   normal. Systolic function was normal. The estimated ejection   fraction was in the range of 60% to 65%. Wall motion was normal;   there were no regional wall motion abnormalities. Doppler   parameters are consistent with abnormal left ventricular   relaxation (grade 1 diastolic dysfunction). - Mitral valve: Calcified annulus. There was mild regurgitation. - Left atrium: The atrium was mildly dilated. - Tricuspid valve: There was moderate regurgitation.  EKG:  EKG is not ordered today. The ekg ordered today demonstrates   Recent Labs: No results found for requested labs within last 8760 hours.   Lipid Panel Followed in primary care   Wt Readings from Last 3 Encounters:  06/15/18 167 lb 12.8 oz (76.1 kg)  12/12/17 172 lb 12.8 oz (78.4 kg)  03/28/17 175 lb (79.4 kg)     Other studies Reviewed: Additional studies/ records that were reviewed today include: . Review of the above records demonstrates:    Assessment and Plan:   1. CAD s/p CABG without angina: No chest pain. He does not tolerate beta blockers due to bradycardia. Continue ASA and statin  2. HLD: Lipids controlled in primary care. LDL 64 in Sept 2019. Continue statin   3. HTN: BP is controlled. No changes  4. Mitral regurgitation: Echo December 2017 with mild MR. Repeat echo in 2021. Will arrange at next office visit.       Current medicines are reviewed at length with the patient today.  The patient does not have concerns regarding medicines.  The following changes have been made:   Labs/ tests ordered today include:   No orders of the defined types were placed in this encounter.   Disposition:  FU with me in 6 months  Signed, Lauree Chandler, MD 06/15/2018 10:55 AM    [facility] [place], [place], [place]  [address] Phone: [phone]; Fax: [phone]

## 2018-06-15 ENCOUNTER — Encounter: Payer: Self-pay | Admitting: Cardiovascular Disease

## 2018-06-15 ENCOUNTER — Ambulatory Visit (INDEPENDENT_AMBULATORY_CARE_PROVIDER_SITE_OTHER): Payer: Medicare Other | Admitting: Cardiovascular Disease

## 2018-06-15 VITALS — BP 122/72 | HR 65 | Ht 69.0 in | Wt 167.8 lb

## 2018-06-15 DIAGNOSIS — E7800 Pure hypercholesterolemia, unspecified: Secondary | ICD-10-CM

## 2018-06-15 DIAGNOSIS — I10 Essential (primary) hypertension: Secondary | ICD-10-CM

## 2018-06-15 DIAGNOSIS — I340 Nonrheumatic mitral (valve) insufficiency: Secondary | ICD-10-CM

## 2018-06-15 DIAGNOSIS — I25810 Atherosclerosis of coronary artery bypass graft(s) without angina pectoris: Secondary | ICD-10-CM | POA: Diagnosis not present

## 2018-06-15 NOTE — Patient Instructions (Signed)

## 2019-01-21 ENCOUNTER — Ambulatory Visit (INDEPENDENT_AMBULATORY_CARE_PROVIDER_SITE_OTHER): Payer: Medicare Other | Admitting: Cardiovascular Disease

## 2019-01-21 ENCOUNTER — Other Ambulatory Visit: Payer: Self-pay

## 2019-01-21 ENCOUNTER — Encounter: Payer: Self-pay | Admitting: Cardiovascular Disease

## 2019-01-21 VITALS — BP 148/82 | HR 65 | Ht 69.0 in | Wt 164.0 lb

## 2019-01-21 DIAGNOSIS — I10 Essential (primary) hypertension: Secondary | ICD-10-CM

## 2019-01-21 DIAGNOSIS — I340 Nonrheumatic mitral (valve) insufficiency: Secondary | ICD-10-CM | POA: Diagnosis not present

## 2019-01-21 DIAGNOSIS — E7800 Pure hypercholesterolemia, unspecified: Secondary | ICD-10-CM | POA: Diagnosis not present

## 2019-01-21 DIAGNOSIS — I25810 Atherosclerosis of coronary artery bypass graft(s) without angina pectoris: Secondary | ICD-10-CM | POA: Diagnosis not present

## 2019-01-21 NOTE — Patient Instructions (Signed)
Medication Instructions:  Your provider recommends that you continue on your current medications as directed. Please refer to the Current Medication list given to you today.    Labwork: None  Testing/Procedures: None  Follow-Up: Your provider wants you to follow-up in: 1 year with Dr. McAlhany. You will receive a reminder letter in the mail two months in advance. If you don't receive a letter, please call our office to schedule the follow-up appointment.    Any Other Special Instructions Will Be Listed Below (If Applicable).     If you need a refill on your cardiac medications before your next appointment, please call your pharmacy.   

## 2019-01-21 NOTE — Progress Notes (Signed)
Chief Complaint  Patient presents with  . Follow-up    CAD    History of Present Illness: 85 yo male with history of CAD s/p 2V CABG in 2012,hyperlipidemia and colon cancer here for follow up. He was found to have severe CAD in 2012 and underwent 2V CABG in August 2012. He has done well since then. He has not tolerated beta blockers due to fatigue.  Echo December 2017 with normal LV systolic function, mild MR. He stopped his Losartan on his own as his BP has been good at home. He has been following his BP at home several times per week.   He is here today for follow up. The patient denies any chest pain, dyspnea, palpitations, lower extremity edema, orthopnea, PND, dizziness, near syncope or syncope.   Primary Care Physician: Orpah Melter, MD  Past Medical History:  Diagnosis Date  . CAD (coronary artery disease)    2V CABG September 2012  . Colon polyps   . ED (erectile dysfunction)   . Hearing loss    wears hearing aids  . History of colon cancer    Followed by Dr Verl Blalock  . HTN (hypertension)   . Hyperlipidemia     Past Surgical History:  Procedure Laterality Date  . APPENDECTOMY    . CABG X 2  01/04/2011   Dr Cyndia Bent  . CARDIAC CATHETERIZATION    . PARTIAL COLECTOMY  1995   Dr March Rummage  . TONSILLECTOMY    . TYMPANOSTOMY TUBE PLACEMENT  4/09    Current Outpatient Medications  Medication Sig Dispense Refill  . aspirin 81 MG tablet Take 81 mg by mouth daily.      . nitroGLYCERIN (NITROSTAT) 0.4 MG SL tablet DISSOLVE 1 TABLET UNDER THE TONGUE EVERY 5 MINUTES AS NEEDED FOR CHEST PAIN 25 tablet 4  . simvastatin (ZOCOR) 20 MG tablet Take 20 mg by mouth daily.     No current facility-administered medications for this visit.     No Known Allergies  Social History   Socioeconomic History  . Marital status: Married    Spouse name: Not on file  . Number of children: 2  . Years of education: 78yrs  . Highest education level: Not on file  Occupational History   . 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 Financial resource strain: Not on file  . Food insecurity    Worry: Not on file    Inability: Not on file  . Transportation needs    Medical: Not on file    Non-medical: Not on file  Tobacco Use  . Smoking status: Former Smoker    Packs/day: 1.00    Years: 17.00    Pack years: 17.00    Types: Cigarettes    Quit date: 05/13/1976    Years since quitting: 42.7  . Smokeless tobacco: Never Used  Substance and Sexual Activity  . Alcohol use: Yes    Alcohol/week: 7.0 standard drinks    Types: 7 Glasses of wine per week  . Drug use: No  . Sexual activity: Not on file  Lifestyle  . Physical activity    Days per week: Not on file    Minutes per session: Not on file  . Stress: Not on file  Relationships  . Social Herbalist on phone: Not on file    Gets together: Not on file    Attends religious service: Not on file    Active member of club  or organization: Not on file    Attends meetings of clubs or organizations: Not on file    Relationship status: Not on file  . Intimate partner violence    Fear of current or ex partner: Not on file    Emotionally abused: Not on file    Physically abused: Not on file    Forced sexual activity: Not on file  Other Topics Concern  . Not on file  Social History Narrative  . Not on file    Family History  Problem Relation Age of Onset  . Heart failure Mother   . Dementia Mother   . Stroke Father 37  . Colon polyps Brother   . Colon polyps Sister   . Parkinson's disease Brother     Review of Systems:  As stated in the HPI and otherwise negative.   BP (!) 148/82   Pulse 65   Ht 5\' 9"  (1.753 m)   Wt 164 lb (74.4 kg)   SpO2 95%   BMI 24.22 kg/m   Physical Examination: General: Well developed, well nourished, NAD  HEENT: OP clear, mucus membranes moist  SKIN: warm, dry. No rashes. Neuro: No focal deficits  Musculoskeletal: Muscle strength 5/5 all ext  Psychiatric:  Mood and affect normal  Neck: No JVD, no carotid bruits, no thyromegaly, no lymphadenopathy.  Lungs:Clear bilaterally, no wheezes, rhonci, crackles Cardiovascular: Regular rate and rhythm. No murmurs, gallops or rubs. Abdomen:Soft. Bowel sounds present. Non-tender.  Extremities: No lower extremity edema. Pulses are 2 + in the bilateral DP/PT.  Echo December 2017: Left ventricle: The cavity size was normal. Wall thickness was   normal. Systolic function was normal. The estimated ejection   fraction was in the range of 60% to 65%. Wall motion was normal;   there were no regional wall motion abnormalities. Doppler   parameters are consistent with abnormal left ventricular   relaxation (grade 1 diastolic dysfunction). - Mitral valve: Calcified annulus. There was mild regurgitation. - Left atrium: The atrium was mildly dilated. - Tricuspid valve: There was moderate regurgitation.  EKG:  EKG is not ordered today. The ekg ordered today demonstrates   Recent Labs: No results found for requested labs within last 8760 hours.   Lipid Panel Followed in primary care   Wt Readings from Last 3 Encounters:  01/21/19 164 lb (74.4 kg)  06/15/18 167 lb 12.8 oz (76.1 kg)  12/12/17 172 lb 12.8 oz (78.4 kg)     Other studies Reviewed: Additional studies/ records that were reviewed today include: . Review of the above records demonstrates:    Assessment and Plan:   1. CAD s/p CABG without angina: He has no chest pain. He does not tolerate beta blockers due to bradycardia. Will continue ASA and statin.   2. HLD: Lipids controlled in primary care. Will continue statin  3. HTN: BP is well controlled at home. He stopped his Losartan. Will not restart unless his BP becomes elevated.   4. Mitral regurgitation: Echo December 2017 with mild MR. Plan to repeat echo in December 2020.   Current medicines are reviewed at length with the patient today.  The patient does not have concerns regarding  medicines.  The following changes have been made:   Labs/ tests ordered today include:   Orders Placed This Encounter  Procedures  . EKG 12-Lead    Disposition:   FU with me in 12 months  Signed, Lauree Chandler, MD 01/21/2019 12:28 PM    [facility]  [place], [place], [place]  [address] Phone: [phone]; Fax: [phone]

## 2019-01-29 DIAGNOSIS — I2510 Atherosclerotic heart disease of native coronary artery without angina pectoris: Secondary | ICD-10-CM | POA: Diagnosis not present

## 2019-01-29 DIAGNOSIS — E7800 Pure hypercholesterolemia, unspecified: Secondary | ICD-10-CM | POA: Diagnosis not present

## 2019-01-29 DIAGNOSIS — Z0000 Encounter for general adult medical examination without abnormal findings: Secondary | ICD-10-CM | POA: Diagnosis not present

## 2019-01-29 DIAGNOSIS — I10 Essential (primary) hypertension: Secondary | ICD-10-CM | POA: Diagnosis not present

## 2019-03-01 DIAGNOSIS — H2513 Age-related nuclear cataract, bilateral: Secondary | ICD-10-CM | POA: Diagnosis not present

## 2019-03-01 DIAGNOSIS — H52203 Unspecified astigmatism, bilateral: Secondary | ICD-10-CM | POA: Diagnosis not present

## 2019-03-04 DIAGNOSIS — Z23 Encounter for immunization: Secondary | ICD-10-CM | POA: Diagnosis not present

## 2019-06-27 DIAGNOSIS — Z23 Encounter for immunization: Secondary | ICD-10-CM | POA: Diagnosis not present

## 2019-07-30 DIAGNOSIS — E7800 Pure hypercholesterolemia, unspecified: Secondary | ICD-10-CM | POA: Diagnosis not present

## 2019-07-30 DIAGNOSIS — I2510 Atherosclerotic heart disease of native coronary artery without angina pectoris: Secondary | ICD-10-CM | POA: Diagnosis not present

## 2019-07-30 DIAGNOSIS — I10 Essential (primary) hypertension: Secondary | ICD-10-CM | POA: Diagnosis not present

## 2019-10-17 ENCOUNTER — Other Ambulatory Visit: Payer: Self-pay | Admitting: Cardiovascular Disease

## 2020-01-14 DIAGNOSIS — E7800 Pure hypercholesterolemia, unspecified: Secondary | ICD-10-CM | POA: Diagnosis not present

## 2020-01-14 DIAGNOSIS — Z0000 Encounter for general adult medical examination without abnormal findings: Secondary | ICD-10-CM | POA: Diagnosis not present

## 2020-01-14 DIAGNOSIS — I10 Essential (primary) hypertension: Secondary | ICD-10-CM | POA: Diagnosis not present

## 2020-01-14 DIAGNOSIS — Z23 Encounter for immunization: Secondary | ICD-10-CM | POA: Diagnosis not present

## 2020-01-14 DIAGNOSIS — Z636 Dependent relative needing care at home: Secondary | ICD-10-CM | POA: Diagnosis not present

## 2020-02-07 ENCOUNTER — Ambulatory Visit (INDEPENDENT_AMBULATORY_CARE_PROVIDER_SITE_OTHER): Payer: Medicare Other | Admitting: Cardiovascular Disease

## 2020-02-07 ENCOUNTER — Other Ambulatory Visit: Payer: Self-pay

## 2020-02-07 ENCOUNTER — Encounter: Payer: Self-pay | Admitting: Cardiovascular Disease

## 2020-02-07 VITALS — BP 118/60 | HR 63 | Ht 69.0 in | Wt 162.4 lb

## 2020-02-07 DIAGNOSIS — I340 Nonrheumatic mitral (valve) insufficiency: Secondary | ICD-10-CM | POA: Diagnosis not present

## 2020-02-07 DIAGNOSIS — I25810 Atherosclerosis of coronary artery bypass graft(s) without angina pectoris: Secondary | ICD-10-CM | POA: Diagnosis not present

## 2020-02-07 DIAGNOSIS — E7800 Pure hypercholesterolemia, unspecified: Secondary | ICD-10-CM | POA: Diagnosis not present

## 2020-02-07 DIAGNOSIS — I10 Essential (primary) hypertension: Secondary | ICD-10-CM

## 2020-02-07 NOTE — Patient Instructions (Signed)

## 2020-02-07 NOTE — Progress Notes (Signed)
Chief Complaint  Patient presents with  . Follow-up    CAD    History of Present Illness: 86 yo male with history of CAD s/p 2V CABG in 2012,hyperlipidemia and colon cancer here for follow up. He was found to have severe CAD in 2012 and underwent 2V CABG in August 2012. He has done well since then. He has not tolerated beta blockers due to fatigue.  Echo December 2017 with normal LV systolic function, mild MR. He stopped his Losartan on his own as his BP has been good at home.   He is here today for follow up. The patient denies any chest pain, dyspnea, palpitations, lower extremity edema, orthopnea, PND, dizziness, near syncope or syncope. Ms Beeks is now on Hospice. He has been taking care of her at home.   Primary Care Physician: Orpah Melter, MD  Past Medical History:  Diagnosis Date  . CAD (coronary artery disease)    2V CABG September 2012  . Colon polyps   . ED (erectile dysfunction)   . Hearing loss    wears hearing aids  . History of colon cancer    Followed by Dr Verl Blalock  . HTN (hypertension)   . Hyperlipidemia     Past Surgical History:  Procedure Laterality Date  . APPENDECTOMY    . CABG X 2  01/04/2011   Dr Cyndia Bent  . CARDIAC CATHETERIZATION    . PARTIAL COLECTOMY  1995   Dr March Rummage  . TONSILLECTOMY    . TYMPANOSTOMY TUBE PLACEMENT  4/09    Current Outpatient Medications  Medication Sig Dispense Refill  . aspirin 81 MG tablet Take 81 mg by mouth daily.      . nitroGLYCERIN (NITROSTAT) 0.4 MG SL tablet DISSOLVE 1 TABLET UNDER THE TONGUE EVERY 5 MINUTES AS NEEDED FOR CHEST PAIN 25 tablet 2  . simvastatin (ZOCOR) 20 MG tablet Take 20 mg by mouth daily.     No current facility-administered medications for this visit.    No Known Allergies  Social History   Socioeconomic History  . Marital status: Married    Spouse name: Not on file  . Number of children: 2  . Years of education: 51yrs  . Highest education level: Not on file  Occupational  History  . 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 Smoking status: Former Smoker    Packs/day: 1.00    Years: 17.00    Pack years: 17.00    Types: Cigarettes    Quit date: 05/13/1976    Years since quitting: 43.7  . Smokeless tobacco: Never Used  Vaping Use  . Vaping Use: Never used  Substance and Sexual Activity  . Alcohol use: Yes    Alcohol/week: 7.0 standard drinks    Types: 7 Glasses of wine per week  . Drug use: No  . Sexual activity: Not on file  Other Topics Concern  . Not on file  Social History Narrative  . Not on file   Social Determinants of Health   Financial Resource Strain:   . Difficulty of Paying Living Expenses: Not on file  Food Insecurity:   . Worried About Charity fundraiser in the Last Year: Not on file  . Ran Out of Food in the Last Year: Not on file  Transportation Needs:   . Lack of Transportation (Medical): Not on file  . Lack of Transportation (Non-Medical): Not on file  Physical Activity:   . Days of Exercise per Week:  Not on file  . Minutes of Exercise per Session: Not on file  Stress:   . Feeling of Stress : Not on file  Social Connections:   . Frequency of Communication with Friends and Family: Not on file  . Frequency of Social Gatherings with Friends and Family: Not on file  . Attends Religious Services: Not on file  . Active Member of Clubs or Organizations: Not on file  . Attends Archivist Meetings: Not on file  . Marital Status: Not on file  Intimate Partner Violence:   . Fear of Current or Ex-Partner: Not on file  . Emotionally Abused: Not on file  . Physically Abused: Not on file  . Sexually Abused: Not on file    Family History  Problem Relation Age of Onset  . Heart failure Mother   . Dementia Mother   . Stroke Father 46  . Colon polyps Brother   . Colon polyps Sister   . Parkinson's disease Brother     Review of Systems:  As stated in the HPI and otherwise negative.   BP 118/60   Pulse 63    Ht 5\' 9"  (1.753 m)   Wt 162 lb 6.4 oz (73.7 kg)   SpO2 98%   BMI 23.98 kg/m   Physical Examination: General: Well developed, well nourished, NAD  HEENT: OP clear, mucus membranes moist  SKIN: warm, dry. No rashes. Neuro: No focal deficits  Musculoskeletal: Muscle strength 5/5 all ext  Psychiatric: Mood and affect normal  Neck: No JVD, no carotid bruits, no thyromegaly, no lymphadenopathy.  Lungs:Clear bilaterally, no wheezes, rhonci, crackles Cardiovascular: Regular rate and rhythm. No murmurs, gallops or rubs. Abdomen:Soft. Bowel sounds present. Non-tender.  Extremities: No lower extremity edema. Pulses are 2 + in the bilateral DP/PT.  Echo December 2017: Left ventricle: The cavity size was normal. Wall thickness was   normal. Systolic function was normal. The estimated ejection   fraction was in the range of 60% to 65%. Wall motion was normal;   there were no regional wall motion abnormalities. Doppler   parameters are consistent with abnormal left ventricular   relaxation (grade 1 diastolic dysfunction). - Mitral valve: Calcified annulus. There was mild regurgitation. - Left atrium: The atrium was mildly dilated. - Tricuspid valve: There was moderate regurgitation.  EKG:  EKG is  ordered today. The ekg ordered today demonstrates sinus, 63 bpm  Recent Labs: No results found for requested labs within last 8760 hours.   Lipid Panel Followed in primary care   Wt Readings from Last 3 Encounters:  02/07/20 162 lb 6.4 oz (73.7 kg)  01/21/19 164 lb (74.4 kg)  06/15/18 167 lb 12.8 oz (76.1 kg)     Other studies Reviewed: Additional studies/ records that were reviewed today include: . Review of the above records demonstrates:    Assessment and Plan:   1. CAD s/p CABG without angina: No chest pain. He does not tolerate beta blockers due to bradycardia. Continue ASA and statin.   2. HLD: Lipids controlled in primary care. LDL near goal this month in primary care. HDL is  98. Continue statin  3. HTN: BP is controlled. No changes  4. Mitral regurgitation: Echo December 2017 with mild MR. Plan to repeat echo at next visit. We decided not to do it today since he has so much going on at home with his wife.   Current medicines are reviewed at length with the patient today.  The patient does not have  concerns regarding medicines.  The following changes have been made:   Labs/ tests ordered today include:   Orders Placed This Encounter  Procedures  . EKG 12-Lead    Disposition:   FU with me in 12 months  Signed, Lauree Chandler, MD 02/07/2020 3:32 PM    [facility] [place], [place], [place]  [address] Phone: [phone]; Fax: [phone]

## 2020-04-13 DIAGNOSIS — Z23 Encounter for immunization: Secondary | ICD-10-CM | POA: Diagnosis not present

## 2021-01-03 ENCOUNTER — Other Ambulatory Visit: Payer: Self-pay | Admitting: Cardiovascular Disease

## 2021-01-31 DIAGNOSIS — Z0000 Encounter for general adult medical examination without abnormal findings: Secondary | ICD-10-CM | POA: Diagnosis not present

## 2021-01-31 DIAGNOSIS — E7800 Pure hypercholesterolemia, unspecified: Secondary | ICD-10-CM | POA: Diagnosis not present

## 2021-01-31 DIAGNOSIS — I10 Essential (primary) hypertension: Secondary | ICD-10-CM | POA: Diagnosis not present

## 2021-01-31 DIAGNOSIS — Z23 Encounter for immunization: Secondary | ICD-10-CM | POA: Diagnosis not present

## 2021-01-31 DIAGNOSIS — I2510 Atherosclerotic heart disease of native coronary artery without angina pectoris: Secondary | ICD-10-CM | POA: Diagnosis not present

## 2021-02-16 ENCOUNTER — Other Ambulatory Visit: Payer: Self-pay

## 2021-02-16 ENCOUNTER — Ambulatory Visit (INDEPENDENT_AMBULATORY_CARE_PROVIDER_SITE_OTHER): Payer: Medicare Other | Admitting: Cardiovascular Disease

## 2021-02-16 ENCOUNTER — Encounter: Payer: Self-pay | Admitting: Cardiovascular Disease

## 2021-02-16 VITALS — BP 140/74 | HR 73 | Ht 70.0 in | Wt 163.0 lb

## 2021-02-16 DIAGNOSIS — I25810 Atherosclerosis of coronary artery bypass graft(s) without angina pectoris: Secondary | ICD-10-CM | POA: Diagnosis not present

## 2021-02-16 DIAGNOSIS — I10 Essential (primary) hypertension: Secondary | ICD-10-CM

## 2021-02-16 DIAGNOSIS — I340 Nonrheumatic mitral (valve) insufficiency: Secondary | ICD-10-CM

## 2021-02-16 DIAGNOSIS — E7800 Pure hypercholesterolemia, unspecified: Secondary | ICD-10-CM | POA: Diagnosis not present

## 2021-02-16 NOTE — Progress Notes (Signed)
Chief Complaint  Patient presents with   Follow-up    CAD     History of Present Illness: 87 yo male with history of CAD s/p 2V CABG in 2012,hyperlipidemia and colon cancer here for follow up. He was found to have severe CAD in 2012 and underwent 2V CABG in August 2012. He has done well since then. He has not tolerated beta blockers due to fatigue.  Echo December 2017 with normal LV systolic function, mild MR. He stopped his Losartan on his own as his BP has been good at home.   He is here today for follow up. The patient denies any chest pain, dyspnea, palpitations, lower extremity edema, orthopnea, PND, dizziness, near syncope or syncope.   Primary Care Physician: Orpah Melter, MD  Past Medical History:  Diagnosis Date   CAD (coronary artery disease)    2V CABG September 2012   Colon polyps    ED (erectile dysfunction)    Hearing loss    wears hearing aids   History of colon cancer    Followed by Dr Verl Blalock   HTN (hypertension)    Hyperlipidemia     Past Surgical History:  Procedure Laterality Date   APPENDECTOMY     CABG X 2  01/04/2011   Dr Cyndia Bent   CARDIAC CATHETERIZATION     PARTIAL COLECTOMY  1995   Dr March Rummage   TONSILLECTOMY     TYMPANOSTOMY TUBE PLACEMENT  4/09    Current Outpatient Medications  Medication Sig Dispense Refill   aspirin 81 MG tablet Take 81 mg by mouth daily.       nitroGLYCERIN (NITROSTAT) 0.4 MG SL tablet DISSOLVE 1 TABLET UNDER THE TONGUE EVERY 5 MINUTES AS NEEDED FOR CHEST PAIN 25 tablet 0   simvastatin (ZOCOR) 20 MG tablet Take 20 mg by mouth daily.     No current facility-administered medications for this visit.    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[employer]  Tobacco Use   Smoking status: Former     Packs/day: 1.00    Years: 17.00    Pack years: 17.00    Types: Cigarettes    Quit date: 05/13/1976    Years since quitting: 44.7   Smokeless tobacco: Never  Vaping Use   Vaping Use: Never used  Substance and Sexual Activity   Alcohol use: Yes    Alcohol/week: 7.0 standard drinks    Types: 7 Glasses of wine per week   Drug use: No   Sexual activity: Not on file  Other Topics Concern   Not on file  Social History Narrative   Not on file   Social Determinants of Health   Financial Resource Strain: Not on file  Food Insecurity: Not on file  Transportation Needs: Not on file  Physical Activity: Not on file  Stress: Not on file  Social Connections: Not on file  Intimate Partner Violence: Not on file    Family History  Problem Relation Age of Onset   Heart failure Mother    Dementia Mother    Stroke Father 3   Colon polyps Brother    Colon polyps Sister    Parkinson's disease Brother     Review of Systems:  As  stated in the HPI and otherwise negative.   BP 140/74   Pulse 73   Ht 5\' 10"  (1.778 m)   Wt 163 lb (73.9 kg)   SpO2 97%   BMI 23.39 kg/m   Physical Examination: General: Well developed, well nourished, NAD  HEENT: OP clear, mucus membranes moist  SKIN: warm, dry. No rashes. Neuro: No focal deficits  Musculoskeletal: Muscle strength 5/5 all ext  Psychiatric: Mood and affect normal  Neck: No JVD, no carotid bruits, no thyromegaly, no lymphadenopathy.  Lungs:Clear bilaterally, no wheezes, rhonci, crackles Cardiovascular: Regular rate and rhythm. No murmurs, gallops or rubs. Abdomen:Soft. Bowel sounds present. Non-tender.  Extremities: No lower extremity edema. Pulses are 2 + in the bilateral DP/PT.  Echo December 2017: Left ventricle: The cavity size was normal. Wall thickness was   normal. Systolic function was normal. The estimated ejection   fraction was in the range of 60% to 65%. Wall motion was normal;   there were no regional wall motion  abnormalities. Doppler   parameters are consistent with abnormal left ventricular   relaxation (grade 1 diastolic dysfunction). - Mitral valve: Calcified annulus. There was mild regurgitation. - Left atrium: The atrium was mildly dilated. - Tricuspid valve: There was moderate regurgitation.  EKG:  EKG is ordered today. The ekg ordered today demonstrates sinus  Recent Labs: No results found for requested labs within last 8760 hours.   Lipid Panel Followed in primary care   Wt Readings from Last 3 Encounters:  02/16/21 163 lb (73.9 kg)  02/07/20 162 lb 6.4 oz (73.7 kg)  01/21/19 164 lb (74.4 kg)     Other studies Reviewed: Additional studies/ records that were reviewed today include: . Review of the above records demonstrates:    Assessment and Plan:   1. CAD s/p CABG without angina: He has no chest pain. Will continue ASA and statin. He does not tolerate beta blockers due to bradycardia.   2. HLD: Lipids controlled in primary care. LDL 66 in September 2022. Continue statin  3. HTN: BP is controlled today. No changes  4. Mitral regurgitation: Echo December 2017 with mild MR. We discussed repeating the echo now but he does not wish to repeat at this time.   Current medicines are reviewed at length with the patient today.  The patient does not have concerns regarding medicines.  The following changes have been made:   Labs/ tests ordered today include:   Orders Placed This Encounter  Procedures   EKG 12-Lead     Disposition:   F/U with me in 12 months  Signed, Lauree Chandler, MD 02/16/2021 2:53 PM    [facility] [address], [place], [place]  [address] Phone: [phone]; Fax: [phone]

## 2021-02-16 NOTE — Patient Instructions (Signed)

## 2021-03-01 DIAGNOSIS — H903 Sensorineural hearing loss, bilateral: Secondary | ICD-10-CM | POA: Diagnosis not present

## 2021-06-29 ENCOUNTER — Other Ambulatory Visit: Payer: Self-pay | Admitting: Cardiovascular Disease

## 2022-01-23 DIAGNOSIS — I10 Essential (primary) hypertension: Secondary | ICD-10-CM | POA: Diagnosis not present

## 2022-01-23 DIAGNOSIS — E7800 Pure hypercholesterolemia, unspecified: Secondary | ICD-10-CM | POA: Diagnosis not present

## 2022-01-23 DIAGNOSIS — I2510 Atherosclerotic heart disease of native coronary artery without angina pectoris: Secondary | ICD-10-CM | POA: Diagnosis not present

## 2022-01-23 DIAGNOSIS — Z23 Encounter for immunization: Secondary | ICD-10-CM | POA: Diagnosis not present

## 2022-01-23 DIAGNOSIS — Z0000 Encounter for general adult medical examination without abnormal findings: Secondary | ICD-10-CM | POA: Diagnosis not present

## 2022-02-15 ENCOUNTER — Ambulatory Visit: Payer: Medicare Other | Attending: Cardiovascular Disease | Admitting: Cardiovascular Disease

## 2022-02-15 ENCOUNTER — Encounter: Payer: Self-pay | Admitting: Cardiovascular Disease

## 2022-02-15 VITALS — BP 138/70 | HR 70 | Ht 70.0 in | Wt 172.8 lb

## 2022-02-15 DIAGNOSIS — I340 Nonrheumatic mitral (valve) insufficiency: Secondary | ICD-10-CM

## 2022-02-15 DIAGNOSIS — E7800 Pure hypercholesterolemia, unspecified: Secondary | ICD-10-CM

## 2022-02-15 DIAGNOSIS — I25810 Atherosclerosis of coronary artery bypass graft(s) without angina pectoris: Secondary | ICD-10-CM | POA: Diagnosis not present

## 2022-02-15 DIAGNOSIS — I10 Essential (primary) hypertension: Secondary | ICD-10-CM | POA: Diagnosis not present

## 2022-02-15 NOTE — Progress Notes (Signed)
Chief Complaint  Patient presents with   Follow-up    CAD    History of Present Illness: 88 yo male with history of CAD s/p 2V CABG in 2012, hyperlipidemia and colon cancer here for follow up. He was found to have severe CAD in 2012 and underwent 2V CABG in August 2012. He has done well since then. He has not tolerated beta blockers due to fatigue.  Echo December 2017 with normal LV systolic function, mild MR.   He is here today for follow up. The patient denies any chest pain, dyspnea, palpitations, lower extremity edema, orthopnea, PND, dizziness, near syncope or syncope.   Primary Care Physician: Orpah Melter, MD  Past Medical History:  Diagnosis Date   CAD (coronary artery disease)    2V CABG September 2012   Colon polyps    ED (erectile dysfunction)    Hearing loss    wears hearing aids   History of colon cancer    Followed by Dr Verl Blalock   HTN (hypertension)    Hyperlipidemia     Past Surgical History:  Procedure Laterality Date   APPENDECTOMY     CABG X 2  01/04/2011   Dr Cyndia Bent   CARDIAC CATHETERIZATION     PARTIAL COLECTOMY  1995   Dr March Rummage   TONSILLECTOMY     TYMPANOSTOMY TUBE PLACEMENT  4/09    Current Outpatient Medications  Medication Sig Dispense Refill   aspirin 81 MG tablet Take 81 mg by mouth daily.       nitroGLYCERIN (NITROSTAT) 0.4 MG SL tablet DISSOLVE 1 TABLET UNDER THE TONGUE EVERY 5 MINUTES AS NEEDED FOR CHEST PAIN 25 tablet 9   simvastatin (ZOCOR) 20 MG tablet Take 20 mg by mouth daily.     No current facility-administered medications for this visit.    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[employer]  Tobacco Use   Smoking status: Former    Packs/day: 1.00    Years: 17.00    Total pack years: 17.00     Types: Cigarettes    Quit date: 05/13/1976    Years since quitting: 45.7   Smokeless tobacco: Never  Vaping Use   Vaping Use: Never used  Substance and Sexual Activity   Alcohol use: Yes    Alcohol/week: 7.0 standard drinks of alcohol    Types: 7 Glasses of wine per week   Drug use: No   Sexual activity: Not on file  Other Topics Concern   Not on file  Social History Narrative   Not on file   Social Determinants of Health   Financial Resource Strain: Not on file  Food Insecurity: Not on file  Transportation Needs: Not on file  Physical Activity: Not on file  Stress: Not on file  Social Connections: Not on file  Intimate Partner Violence: Not on file    Family History  Problem Relation Age of Onset   Heart failure Mother    Dementia Mother    Stroke Father 834  Colon polyps Brother    Colon polyps Sister    Parkinson's disease Brother     Review of Systems:  As stated in the HPI and otherwise negative.   BP 138/70  Pulse 70   Ht '5\' 10"'$  (1.778 m)   Wt 172 lb 12.8 oz (78.4 kg)   SpO2 99%   BMI 24.79 kg/m   Physical Examination: General: Well developed, well nourished, NAD  HEENT: OP clear, mucus membranes moist  SKIN: warm, dry. No rashes. Neuro: No focal deficits  Musculoskeletal: Muscle strength 5/5 all ext  Psychiatric: Mood and affect normal  Neck: No JVD, no carotid bruits, no thyromegaly, no lymphadenopathy.  Lungs:Clear bilaterally, no wheezes, rhonci, crackles Cardiovascular: Regular rate and rhythm. No murmurs, gallops or rubs. Abdomen:Soft. Bowel sounds present. Non-tender.  Extremities: No lower extremity edema. Pulses are 2 + in the bilateral DP/PT.  Echo December 2017: Left ventricle: The cavity size was normal. Wall thickness was   normal. Systolic function was normal. The estimated ejection   fraction was in the range of 60% to 65%. Wall motion was normal;   there were no regional wall motion abnormalities. Doppler   parameters are  consistent with abnormal left ventricular   relaxation (grade 1 diastolic dysfunction). - Mitral valve: Calcified annulus. There was mild regurgitation. - Left atrium: The atrium was mildly dilated. - Tricuspid valve: There was moderate regurgitation.  EKG:  EKG is ordered today. The ekg ordered today demonstrates Sinus, PACs  Recent Labs: No results found for requested labs within last 365 days.   Lipid Panel Followed in primary care   Wt Readings from Last 3 Encounters:  02/15/22 172 lb 12.8 oz (78.4 kg)  02/16/21 163 lb (73.9 kg)  02/07/20 162 lb 6.4 oz (73.7 kg)    Assessment and Plan:   1. CAD s/p CABG without angina: No chest pain. Continue ASA and statin. He does not tolerate beta blockers due to bradycardia.   2. HLD: Lipids controlled in primary care. LDL near goal in Sept 2023.  Continue statin  3. HTN: BP is well controlled at home. I have reviewed his readings today and the systolic BP is 882-800 over the last two weeks.   4. Mitral regurgitation: Echo December 2017 with mild MR. We discussed repeating the echo now but he does not wish to repeat at this time.   Labs/ tests ordered today include:   Orders Placed This Encounter  Procedures   EKG 12-Lead   Disposition:   F/U with me in 12 months  Signed, Lauree Chandler, MD 02/15/2022 3:55 PM    [facility] [place], [place], [place]  [address] Phone: [phone]; Fax: [phone]

## 2022-02-15 NOTE — Patient Instructions (Signed)
Medication Instructions:  No changes *If you need a refill on your cardiac medications before your next appointment, please call your pharmacy*   Lab Work: none If you have labs (blood work) drawn today and your tests are completely normal, you will receive your results only by: [facility] (if you have MyChart) OR A paper copy in the mail If you have any lab test that is abnormal or we need to change your treatment, we will call you to review the results.   Testing/Procedures: none   Follow-Up: At [facility], you and your health needs are our priority.  As part of our continuing mission to provide you with exceptional heart care, we have created designated Provider Care Teams.  These Care Teams include your primary Cardiologist (physician) and Advanced Practice Providers (APPs -  Physician Assistants and Nurse Practitioners) who all work together to provide you with the care you need, when you need it.   Your next appointment:   12 month(s)  The format for your next appointment:   In Person  Provider:   Lauree Chandler, MD

## 2022-07-20 ENCOUNTER — Other Ambulatory Visit: Payer: Self-pay | Admitting: Cardiovascular Disease

## 2023-01-28 DIAGNOSIS — Z23 Encounter for immunization: Secondary | ICD-10-CM | POA: Diagnosis not present

## 2023-01-28 DIAGNOSIS — L989 Disorder of the skin and subcutaneous tissue, unspecified: Secondary | ICD-10-CM | POA: Diagnosis not present

## 2023-01-28 DIAGNOSIS — E7800 Pure hypercholesterolemia, unspecified: Secondary | ICD-10-CM | POA: Diagnosis not present

## 2023-01-28 DIAGNOSIS — I2510 Atherosclerotic heart disease of native coronary artery without angina pectoris: Secondary | ICD-10-CM | POA: Diagnosis not present

## 2023-01-28 DIAGNOSIS — I10 Essential (primary) hypertension: Secondary | ICD-10-CM | POA: Diagnosis not present

## 2023-01-28 DIAGNOSIS — Z0000 Encounter for general adult medical examination without abnormal findings: Secondary | ICD-10-CM | POA: Diagnosis not present

## 2023-01-28 DIAGNOSIS — D692 Other nonthrombocytopenic purpura: Secondary | ICD-10-CM | POA: Diagnosis not present

## 2023-01-28 LAB — LAB REPORT - SCANNED
Creatinine, POC: 58 mg/dL
EGFR: 75
Microalb Creat Ratio: 12
Microalbumin, Urine: 0.7

## 2023-02-18 ENCOUNTER — Encounter: Payer: Self-pay | Admitting: Cardiovascular Disease

## 2023-02-18 ENCOUNTER — Ambulatory Visit: Payer: Medicare Other | Attending: Cardiovascular Disease | Admitting: Cardiovascular Disease

## 2023-02-18 VITALS — BP 126/78 | HR 61 | Ht 69.0 in | Wt 173.8 lb

## 2023-02-18 DIAGNOSIS — E7800 Pure hypercholesterolemia, unspecified: Secondary | ICD-10-CM | POA: Diagnosis not present

## 2023-02-18 DIAGNOSIS — I25810 Atherosclerosis of coronary artery bypass graft(s) without angina pectoris: Secondary | ICD-10-CM | POA: Diagnosis not present

## 2023-02-18 DIAGNOSIS — I10 Essential (primary) hypertension: Secondary | ICD-10-CM | POA: Insufficient documentation

## 2023-02-18 NOTE — Patient Instructions (Signed)

## 2023-02-18 NOTE — Progress Notes (Signed)
Chief Complaint  Patient presents with   Follow-up    CAD   History of Present Illness: 89 yo male with history of CAD s/p 2V CABG in 2012, hyperlipidemia and colon cancer here for follow up. He was found to have severe CAD in 2012 and underwent 2V CABG in August 2012. He has done well since then. He has not tolerated beta blockers due to fatigue.  Echo December 2017 with normal LV systolic function, mild MR.   He is here today for follow up. The patient denies any chest pain, dyspnea, palpitations, lower extremity edema, orthopnea, PND, dizziness, near syncope or syncope.   Primary Care Physician: Joycelyn Rua, MD  Past Medical History:  Diagnosis Date   CAD (coronary artery disease)    2V CABG September 2012   Colon polyps    ED (erectile dysfunction)    Hearing loss    wears hearing aids   History of colon cancer    Followed by Dr Sheryn Bison   HTN (hypertension)    Hyperlipidemia     Past Surgical History:  Procedure Laterality Date   APPENDECTOMY     CABG X 2  01/04/2011   Dr Laneta Simmers   CARDIAC CATHETERIZATION     PARTIAL COLECTOMY  1995   Dr Samuella Cota   TONSILLECTOMY     TYMPANOSTOMY TUBE PLACEMENT  4/09    Current Outpatient Medications  Medication Sig Dispense Refill   aspirin 81 MG tablet Take 81 mg by mouth daily.       nitroGLYCERIN (NITROSTAT) 0.4 MG SL tablet DISSOLVE 1 TABLET UNDER THE TONGUE EVERY 5 MINUTES AS NEEDED FOR CHEST PAIN 25 tablet 3   simvastatin (ZOCOR) 20 MG tablet Take 20 mg by mouth daily.     No current facility-administered medications for this visit.    No Known Allergies  Social History   Socioeconomic History   Marital status: Widowed    Spouse name: Not on file   Number of children: 2   Years of education: 39yrs   Highest education level: Not on file  Occupational History   Occupation: retired pilot [employer]  Tobacco Use   Smoking status: Former    Current packs/day: 0.00    Average packs/day: 1 pack/day for 17.0  years (17.0 ttl pk-yrs)    Types: Cigarettes    Start date: 05/14/1959    Quit date: 05/13/1976    Years since quitting: 46.8   Smokeless tobacco: Never  Vaping Use   Vaping status: Never Used  Substance and Sexual Activity   Alcohol use: Yes    Alcohol/week: 7.0 standard drinks of alcohol    Types: 7 Glasses of wine per week   Drug use: No   Sexual activity: Not on file  Other Topics Concern   Not on file  Social History Narrative   Not on file   Social Determinants of Health   Financial Resource Strain: Not on file  Food Insecurity: Not on file  Transportation Needs: Not on file  Physical Activity: Not on file  Stress: Not on file  Social Connections: Not on file  Intimate Partner Violence: Not on file    Family History  Problem Relation Age of Onset   Heart failure Mother    Dementia Mother    Stroke Father 65   Colon polyps Brother    Colon polyps Sister    Parkinson's disease Brother     Review of Systems:  As stated in the HPI and  otherwise negative.   BP 126/78   Pulse 61   Ht 5\' 9"  (1.753 m)   Wt 78.8 kg   SpO2 99%   BMI 25.67 kg/m   Physical Examination: General: Well developed, well nourished, NAD  HEENT: OP clear, mucus membranes moist  SKIN: warm, dry. No rashes. Neuro: No focal deficits  Musculoskeletal: Muscle strength 5/5 all ext  Psychiatric: Mood and affect normal  Neck: No JVD, no carotid bruits, no thyromegaly, no lymphadenopathy.  Lungs:Clear bilaterally, no wheezes, rhonci, crackles Cardiovascular: Regular rate and rhythm. No murmurs, gallops or rubs. Abdomen:Soft. Bowel sounds present. Non-tender.  Extremities: No lower extremity edema. Pulses are 2 + in the bilateral DP/PT.  Echo December 2017: Left ventricle: The cavity size was normal. Wall thickness was   normal. Systolic function was normal. The estimated ejection   fraction was in the range of 60% to 65%. Wall motion was normal;   there were no regional wall motion  abnormalities. Doppler   parameters are consistent with abnormal left ventricular   relaxation (grade 1 diastolic dysfunction). - Mitral valve: Calcified annulus. There was mild regurgitation. - Left atrium: The atrium was mildly dilated. - Tricuspid valve: There was moderate regurgitation.  EKG:  EKG is ordered today. The ekg ordered today demonstrates  EKG Interpretation Date/Time:  Tuesday February 18 2023 15:47:22 EDT Ventricular Rate:  61 PR Interval:  158 QRS Duration:  92 QT Interval:  452 QTC Calculation: 455 R Axis:   -8  Text Interpretation: Normal sinus rhythm Normal ECG When compared with ECG of 05-Jan-2011 06:42, Non-specific change in ST segment in Inferior leads ST no longer elevated in Anterolateral leads Confirmed by Verne Carrow [phone]) on 02/18/2023 3:56:58 PM    Recent Labs: No results found for requested labs within last 365 days.   Lipid Panel Followed in primary care   Wt Readings from Last 3 Encounters:  02/18/23 78.8 kg  02/15/22 78.4 kg  02/16/21 73.9 kg    Assessment and Plan:   1. CAD s/p CABG without angina: No chest pain. Will continue ASA and statin. He does not tolerate beta blockers due to bradycardia.   2. HLD: Lipids controlled in primary care. LDL near goal in September 2024. Continue statin  3. HTN: BP is controlled. No changes  4. Mitral regurgitation: Echo December 2017 with mild MR. No loud murmur on exam. We discussed repeating the echo now but he does not wish to repeat  Labs/ tests ordered today include:   Orders Placed This Encounter  Procedures   EKG 12-Lead   Disposition:   F/U with me in 12 months  Signed, Verne Carrow, MD 02/18/2023 4:05 PM    [facility] [address] [place], [place], [place]  [address] Phone: [phone]; Fax: [phone]

## 2023-02-26 ENCOUNTER — Ambulatory Visit: Payer: Medicare Other | Admitting: Cardiovascular Disease

## 2024-02-04 DIAGNOSIS — Z1331 Encounter for screening for depression: Secondary | ICD-10-CM | POA: Diagnosis not present

## 2024-02-04 DIAGNOSIS — I2510 Atherosclerotic heart disease of native coronary artery without angina pectoris: Secondary | ICD-10-CM | POA: Diagnosis not present

## 2024-02-04 DIAGNOSIS — Z6825 Body mass index (BMI) 25.0-25.9, adult: Secondary | ICD-10-CM | POA: Diagnosis not present

## 2024-02-04 DIAGNOSIS — I10 Essential (primary) hypertension: Secondary | ICD-10-CM | POA: Diagnosis not present

## 2024-02-04 DIAGNOSIS — R7303 Prediabetes: Secondary | ICD-10-CM | POA: Diagnosis not present

## 2024-02-04 DIAGNOSIS — D692 Other nonthrombocytopenic purpura: Secondary | ICD-10-CM | POA: Diagnosis not present

## 2024-02-04 DIAGNOSIS — E7800 Pure hypercholesterolemia, unspecified: Secondary | ICD-10-CM | POA: Diagnosis not present

## 2024-02-04 DIAGNOSIS — Z23 Encounter for immunization: Secondary | ICD-10-CM | POA: Diagnosis not present

## 2024-02-04 DIAGNOSIS — Z0000 Encounter for general adult medical examination without abnormal findings: Secondary | ICD-10-CM | POA: Diagnosis not present

## 2024-02-12 NOTE — Progress Notes (Unsigned)
 No chief complaint on file.  History of Present Illness: 89 yo male with history of CAD s/p 2V CABG in 2012, HTN, mitral regurgitation, hyperlipidemia and colon cancer here for follow up. He was found to have severe CAD in 2012 and underwent 2V CABG in August 2012. He has not tolerated beta blockers due to fatigue.  Echo December 2017 with normal LV systolic function, mild MR. He has not wished to repeat his echo.   He is here today for follow up. The patient denies any chest pain, dyspnea, palpitations, lower extremity edema, orthopnea, PND, dizziness, near syncope or syncope.   Primary Care Physician: Nanci Senior, MD  Past Medical History:  Diagnosis Date   CAD (coronary artery disease)    2V CABG September 2012   Colon polyps    ED (erectile dysfunction)    Hearing loss    wears hearing aids   History of colon cancer    Followed by Dr Alm Gander   HTN (hypertension)    Hyperlipidemia     Past Surgical History:  Procedure Laterality Date   APPENDECTOMY     CABG X 2  01/04/2011   Dr Lucas   CARDIAC CATHETERIZATION     PARTIAL COLECTOMY  1995   Dr Gretel   TONSILLECTOMY     TYMPANOSTOMY TUBE PLACEMENT  4/09    Current Outpatient Medications  Medication Sig Dispense Refill   aspirin 81 MG tablet Take 81 mg by mouth daily.       nitroGLYCERIN  (NITROSTAT ) 0.4 MG SL tablet DISSOLVE 1 TABLET UNDER THE TONGUE EVERY 5 MINUTES AS NEEDED FOR CHEST PAIN 25 tablet 3   simvastatin  (ZOCOR ) 20 MG tablet Take 20 mg by mouth daily.     No current facility-administered medications for this visit.    No Known Allergies  Social History   Socioeconomic History   Marital status: Widowed    Spouse name: Not on file   Number of children: 2   Years of education: 51yrs   Highest education level: Not on file  Occupational History   Occupation: retired Actuary  Tobacco Use   Smoking status: Former    Current packs/day: 0.00    Average packs/day: 1 pack/day for 17.0  years (17.0 ttl pk-yrs)    Types: Cigarettes    Start date: 05/14/1959    Quit date: 05/13/1976    Years since quitting: 47.7   Smokeless tobacco: Never  Vaping Use   Vaping status: Never Used  Substance and Sexual Activity   Alcohol use: Yes    Alcohol/week: 7.0 standard drinks of alcohol    Types: 7 Glasses of wine per week   Drug use: No   Sexual activity: Not on file  Other Topics Concern   Not on file  Social History Narrative   Not on file   Social Drivers of Health   Financial Resource Strain: Not on file  Food Insecurity: Not on file  Transportation Needs: Not on file  Physical Activity: Not on file  Stress: Not on file  Social Connections: Not on file  Intimate Partner Violence: Not on file    Family History  Problem Relation Age of Onset   Heart failure Mother    Dementia Mother    Stroke Father 71   Colon polyps Brother    Colon polyps Sister    Parkinson's disease Brother     Review of Systems:  As stated in the HPI and otherwise negative.  There were no vitals taken for this visit.  Physical Examination: General: Well developed, well nourished, NAD  HEENT: OP clear, mucus membranes moist  SKIN: warm, dry. No rashes. Neuro: No focal deficits  Musculoskeletal: Muscle strength 5/5 all ext  Psychiatric: Mood and affect normal  Neck: No JVD, no carotid bruits, no thyromegaly, no lymphadenopathy.  Lungs:Clear bilaterally, no wheezes, rhonci, crackles Cardiovascular: Regular rate and rhythm. No murmurs, gallops or rubs. Abdomen:Soft. Bowel sounds present. Non-tender.  Extremities: No lower extremity edema. Pulses are 2 + in the bilateral DP/PT.  Echo December 2017: Left ventricle: The cavity size was normal. Wall thickness was   normal. Systolic function was normal. The estimated ejection   fraction was in the range of 60% to 65%. Wall motion was normal;   there were no regional wall motion abnormalities. Doppler   parameters are consistent with  abnormal left ventricular   relaxation (grade 1 diastolic dysfunction). - Mitral valve: Calcified annulus. There was mild regurgitation. - Left atrium: The atrium was mildly dilated. - Tricuspid valve: There was moderate regurgitation.  EKG:  EKG is *** ordered today. The ekg ordered today demonstrates ***  Recent Labs: No results found for requested labs within last 365 days.   Lipid Panel Followed in primary care   Wt Readings from Last 3 Encounters:  02/18/23 173 lb 12.8 oz (78.8 kg)  02/15/22 172 lb 12.8 oz (78.4 kg)  02/16/21 163 lb (73.9 kg)    Assessment and Plan:   1. CAD s/p CABG without angina: No chest pain. Continue ASA and statin. He does not tolerate beta blockers due to bradycardia.   2. HLD: Lipids controlled in primary care. LDL ***. Continue statin.   3. HTN: BP is controlled. Continue current therapy  4. Mitral regurgitation: Echo December 2017 with mild MR. No loud murmur on exam. He does not wish to repeat his echo.   Labs/ tests ordered today include:  No orders of the defined types were placed in this encounter.  Disposition:   F/U with me in 12 months  Signed, Lonni Cash, MD 02/12/2024 3:23 PM    [facility] [address] [place], [place], [place]  [address] Phone: [phone]; Fax: [phone]

## 2024-02-13 ENCOUNTER — Ambulatory Visit: Attending: Cardiovascular Disease | Admitting: Cardiovascular Disease

## 2024-02-13 ENCOUNTER — Encounter: Payer: Self-pay | Admitting: Cardiovascular Disease

## 2024-02-13 VITALS — BP 124/64 | HR 89 | Resp 17 | Ht 69.0 in | Wt 169.0 lb

## 2024-02-13 DIAGNOSIS — I10 Essential (primary) hypertension: Secondary | ICD-10-CM | POA: Insufficient documentation

## 2024-02-13 DIAGNOSIS — I340 Nonrheumatic mitral (valve) insufficiency: Secondary | ICD-10-CM | POA: Insufficient documentation

## 2024-02-13 DIAGNOSIS — I25810 Atherosclerosis of coronary artery bypass graft(s) without angina pectoris: Secondary | ICD-10-CM | POA: Diagnosis not present

## 2024-02-13 DIAGNOSIS — E7800 Pure hypercholesterolemia, unspecified: Secondary | ICD-10-CM | POA: Insufficient documentation

## 2024-02-13 NOTE — Patient Instructions (Signed)
 Medication Instructions:  Your physician recommends that you continue on your current medications as directed. Please refer to the Current Medication list given to you today.  *If you need a refill on your cardiac medications before your next appointment, please call your pharmacy* Follow-Up: At [facility], you and your health needs are our priority.  As part of our continuing mission to provide you with exceptional heart care, our providers are all part of one team.  This team includes your primary Cardiologist (physician) and Advanced Practice Providers or APPs (Physician Assistants and Nurse Practitioners) who all work together to provide you with the care you need, when you need it.  Your next appointment:   1 year  Provider:   Lonni Cash, MD
